# Patient Record
Sex: Female | Born: 1965 | Race: Black or African American | Hispanic: No | Marital: Married | Smoking: Never smoker
Health system: Southern US, Community
[De-identification: ages and names within clinical notes are randomized; demographics above are authoritative.]

## PROBLEM LIST (undated history)

## (undated) DIAGNOSIS — D649 Anemia, unspecified: Secondary | ICD-10-CM

## (undated) HISTORY — PX: DILATION AND CURETTAGE OF UTERUS: SHX78

## (undated) HISTORY — PX: WISDOM TOOTH EXTRACTION: SHX21

## (undated) HISTORY — PX: BREAST EXCISIONAL BIOPSY: SUR124

## (undated) HISTORY — PX: OTHER SURGICAL HISTORY: SHX169

---

## 1998-05-06 ENCOUNTER — Other Ambulatory Visit: Admission: RE | Admit: 1998-05-06 | Discharge: 1998-05-06 | Payer: Self-pay | Admitting: Obstetrics and Gynecology

## 1999-05-08 ENCOUNTER — Other Ambulatory Visit: Admission: RE | Admit: 1999-05-08 | Discharge: 1999-05-08 | Payer: Self-pay | Admitting: Obstetrics and Gynecology

## 2000-06-03 ENCOUNTER — Other Ambulatory Visit: Admission: RE | Admit: 2000-06-03 | Discharge: 2000-06-03 | Payer: Self-pay | Admitting: Obstetrics and Gynecology

## 2001-06-10 ENCOUNTER — Other Ambulatory Visit: Admission: RE | Admit: 2001-06-10 | Discharge: 2001-06-10 | Payer: Self-pay | Admitting: Obstetrics and Gynecology

## 2002-05-25 ENCOUNTER — Emergency Department (HOSPITAL_COMMUNITY): Admission: EM | Admit: 2002-05-25 | Discharge: 2002-05-25 | Payer: Self-pay | Admitting: Emergency Medicine

## 2002-06-01 ENCOUNTER — Emergency Department (HOSPITAL_COMMUNITY): Admission: EM | Admit: 2002-06-01 | Discharge: 2002-06-01 | Payer: Self-pay | Admitting: Emergency Medicine

## 2002-07-31 ENCOUNTER — Other Ambulatory Visit: Admission: RE | Admit: 2002-07-31 | Discharge: 2002-07-31 | Payer: Self-pay | Admitting: Obstetrics and Gynecology

## 2003-09-02 ENCOUNTER — Other Ambulatory Visit: Admission: RE | Admit: 2003-09-02 | Discharge: 2003-09-02 | Payer: Self-pay | Admitting: Obstetrics and Gynecology

## 2004-11-01 ENCOUNTER — Other Ambulatory Visit: Admission: RE | Admit: 2004-11-01 | Discharge: 2004-11-01 | Payer: Self-pay | Admitting: Obstetrics and Gynecology

## 2006-05-17 ENCOUNTER — Ambulatory Visit (HOSPITAL_COMMUNITY): Admission: RE | Admit: 2006-05-17 | Discharge: 2006-05-17 | Payer: Self-pay | Admitting: Obstetrics and Gynecology

## 2006-05-17 ENCOUNTER — Encounter (INDEPENDENT_AMBULATORY_CARE_PROVIDER_SITE_OTHER): Payer: Self-pay | Admitting: Specialist

## 2007-02-26 ENCOUNTER — Encounter: Admission: RE | Admit: 2007-02-26 | Discharge: 2007-02-26 | Payer: Self-pay | Admitting: Obstetrics and Gynecology

## 2007-04-04 ENCOUNTER — Encounter (INDEPENDENT_AMBULATORY_CARE_PROVIDER_SITE_OTHER): Payer: Self-pay | Admitting: General Surgery

## 2007-04-04 ENCOUNTER — Ambulatory Visit (HOSPITAL_BASED_OUTPATIENT_CLINIC_OR_DEPARTMENT_OTHER): Admission: RE | Admit: 2007-04-04 | Discharge: 2007-04-04 | Payer: Self-pay | Admitting: General Surgery

## 2009-04-06 ENCOUNTER — Encounter: Admission: RE | Admit: 2009-04-06 | Discharge: 2009-04-06 | Payer: Self-pay | Admitting: Obstetrics and Gynecology

## 2010-07-25 NOTE — Op Note (Signed)
NAME:  Vanessa Lopez, Vanessa Lopez                  ACCOUNT NO.:  [id number]   MEDICAL RECORD NO.:  [id number]          PATIENT TYPE:  AMB   LOCATION:  DSC                          FACILITY:  [facility]   PHYSICIAN:  Lennie Muckle, MD      DATE OF BIRTH:  May 07, 1965   DATE OF PROCEDURE:  04/04/2007  DATE OF DISCHARGE:                               OPERATIVE REPORT   PREOPERATIVE DIAGNOSIS:  Left breast papilloma.   POSTOPERATIVE DIAGNOSIS:  Left breast papilloma.   PROCEDURE:  Excision of left breast tissue.   ANTHESIA: The patient given MAC anesthesia.   SPECIMEN:  Left breast tissue.   SURGEON:  Lennie Muckle, MD   ASSISTANT:  None.   FINDINGS:  Serous drainage from approximately 2 o'clock on the left  nipple.   ESTIMATED BLOOD LOSS:  Minimal.   COMPLICATIONS:  No immediate complications.   INDICATIONS FOR PROCEDURE:  [name] is a 45 year old female who had  had spontaneous nipple drainage on the left breast.  Ductogram revealed  a papilloma in the left breast approximately in the upper outer quadrant  of the left breast.  On examination she did express fluid from her left  breast.  I recommended excision of the papilloma due to the possibility  of 20% relation with invasive carcinoma.  Informed consent was obtained  prior to the procedure.   DESCRIPTION OF PROCEDURE:  [name] was identified in the preoperative  holding suite.  She was then taken to the operating room.  Once placed  in a supine position she was placed under anesthesia.  She was given MAC  anesthesia.  Her left breast was then prepped and draped in the usual  sterile fashion.  A time out indicating the procedure and the patient  were performed.  Using a mixture of 1% lidocaine and 0.25% Marcaine, I  anesthetized the tissue around the areola in approximately the 12  o'clock to 3 o'clock position.  Skin was incised with a #11 blade along  the areola from 12 to 3 o'clock.  Subcutaneous tissues divided with  electrocautery.  I excised tissue just beneath the nipple areola  complex.  During the procedure there was some drainage elicited.  The  core was then followed deep into the breast, approximately 5 cm.  Using  the electrocautery I removed the specimen.  Anterior margin was marked  at a short suture and lateral was marked with a long, double suture.  The area was irrigated.  Tissues inspected.  Bleeding controlled with  electrocautery.  Minimal bleeding was controlled with electrocautery.  The wound bed was irrigated.  Final inspection revealed no bleeding from  the wound bed.  The breast tissue was reapproximated using 3-0 Vicryl.  Dermal stitch placed of 3-0 Vicryl.  Skin was closed with 4-0 Monocryl  in a running fashion.  Steri-Strips were placed for final dressing.  I  then injected the excision pocket with approximately  a total of 30 mL of the lidocaine/Marcaine mixture.  The patient was  extubated and transported to the Post Anesthesia Care Unit in  stable  condition.  She will follow-up with me in 2-3 weeks.  I will call her  with the pathology when it is available.      Lennie Muckle, MD  Electronically Signed     ALA/MEDQ  D:  04/04/2007  T:  04/04/2007  Job:  [id number]

## 2010-07-28 NOTE — Op Note (Signed)
NAME:  Vanessa Lopez, FERNICOLA NO.:  [id number]   MEDICAL RECORD NO.:  [id number]          PATIENT TYPE:  AMB   LOCATION:  SDC                           FACILITY:  [facility]   PHYSICIAN:  [name], M.D.   DATE OF BIRTH:  15-May-1965   DATE OF PROCEDURE:  05/17/2006  DATE OF DISCHARGE:                               OPERATIVE REPORT   PREOPERATIVE DIAGNOSIS:  Nonviable first trimester of pregnancy.   POSTOPERATIVE DIAGNOSIS:  Nonviable first trimester of pregnancy.   OPERATIVE PROCEDURE:  1. Paracervical block.  2. Dilatation and evacuation.   SURGEON:  [name], M.D.   ANESTHESIA:  Paracervical block and sedation.   ESTIMATED BLOOD LOSS:  Minimal.   PACKS AND DRAINS:  None.   INTRAOPERATIVE BLOOD REPLACEMENT:  None.   COMPLICATIONS:  None.   INDICATIONS:  Dictated in history and physical.   PROCEDURE:  The patient was taken to the OR and placed in supine  position.  After a satisfactory level of sedation, the patient was  placed in the dorsal lithotomy position using the Allen stirrups.  The  patient was draped in a sterile field.  A speculum was placed in the  vaginal vault.  The cervix and vagina were cleansed with Betadine.  Paracervical block was instituted using 1% Nesacaine.  Cervix was  grasped with a single-tooth tenaculum.  The uterus sounded to  approximately 11 cm.  The cervix was serially dilated to a size 31 Pratt  dilator.  A size 8 curved suction curette was introduced and the  intrauterine cavity was emptied using suction curetting; this was  continued until no additional tissue was obtained.  We then sharply  curetted and we felt that all the walls of the uterus were clear by the  gritty feel.  Repeat suction curetting revealed no additional tissue.  Speculum and single-tooth  tenaculum were then removed.  The patient was then taken out of the  dorsal lithotomy position and once alert, transferred to recovery room  in good condition.   Sponge, instrument and needle count were as correct  by circulating nurse.      [name], M.D.  Electronically Signed     JSM/MEDQ  D:  05/17/2006  T:  05/17/2006  Job:  [id number]

## 2010-07-28 NOTE — H&P (Signed)
NAME:  Vanessa Lopez, Vanessa Lopez NO.:  [id number]   MEDICAL RECORD NO.:  [id number]          PATIENT TYPE:  AMB   LOCATION:  SDC                           [facility]   PHYSICIAN:  [name], M.D.   DATE OF BIRTH:  23-Aug-1965   DATE OF ADMISSION:  05/17/2006  DATE OF DISCHARGE:                              HISTORY & PHYSICAL   The patient is 45 year old gravida 2, para 1, female who presents for  D&E.   The patient's last menstrual period was December 19, giving her an  estimated gestational age of approximately 11 weeks.  Was seen the  office yesterday.  Fetal heart tones were not audible.  Subsequent  ultrasound revealed an empty sac at 7 weeks with no evidence of fetal  pole or heart activity, consistent with a nonviable first trimester  pregnancy.  The patient's blood type is O+.   ALLERGIES:  No known drug allergies.   MEDICATION:  Prenatal vitamins.   PAST MEDICAL HISTORY:  Usual childhood diseases.  No significant  sequelae.  No previous surgical history.   OBSTETRICAL HISTORY:  She has had one previous vaginal delivery.   FAMILY HISTORY:  Noncontributory.   SOCIAL HISTORY:  No tobacco or alcohol use.   REVIEW OF SYSTEMS:  Noncontributory.   PHYSICAL EXAMINATION:  VITAL SIGNS:  The patient is afebrile with stable  vital signs.  HEENT: The patient is normocephalic.  Pupils equal, round and reactive  to light and accommodation.  Extraocular movements were intact.  Sclerae  and conjunctivae clear.  Oropharynx clear.  NECK:  Without thyromegaly.  BREASTS:  No discrete masses.  LUNGS:  Clear.  CARDIAC:  Regular rhythm and rate without murmurs or gallops.  ABDOMEN:  Benign.  No mass, organomegaly or tenderness.  PELVIC:  Normal external genitalia.  Vaginal mucosa is clear.  Cervix  unremarkable.  Uterus upper limits of normal size.  Adnexa unremarkable.  EXTREMITIES:  Trace edema.  NEUROLOGIC:  Grossly within normal limits.   IMPRESSION:  Nonviable  first trimester pregnancy.   PLAN:  The patient will undergo dilatation and evacuation.  The risks  have been discussed, including the risk of infection; the risk of  vascular injury that could lead to hemorrhage requiring transfusion with  associated risk of  AIDS or hepatitis.  Continued bleeding could lead to hysterectomy.  The  risk of perforation that could lead to injury to adjacent organs  requiring exploratory surgery.  The risk of deep venous thrombosis and  pulmonary emboli.  The patient expressed an understanding of the  indications and risks.      [name], M.D.  Electronically Signed     JSM/MEDQ  D:  05/17/2006  T:  05/17/2006  Job:  [id number]

## 2010-12-29 ENCOUNTER — Encounter (HOSPITAL_COMMUNITY): Payer: Self-pay

## 2010-12-29 ENCOUNTER — Encounter (HOSPITAL_COMMUNITY)
Admission: RE | Admit: 2010-12-29 | Discharge: 2010-12-29 | Disposition: A | Discharge status: Home / Self Care | Payer: Managed Care, Other (non HMO) | Source: Ambulatory Visit | Attending: Obstetrics and Gynecology | Admitting: Obstetrics and Gynecology

## 2010-12-29 DIAGNOSIS — Z01818 Encounter for other preprocedural examination: Secondary | ICD-10-CM | POA: Insufficient documentation

## 2010-12-29 DIAGNOSIS — Z01812 Encounter for preprocedural laboratory examination: Secondary | ICD-10-CM | POA: Insufficient documentation

## 2010-12-29 HISTORY — DX: Anemia, unspecified: D64.9

## 2010-12-29 LAB — SURGICAL PCR SCREEN: MRSA, PCR: NEGATIVE

## 2010-12-29 LAB — CBC
Hemoglobin: 10.5 g/dL — ABNORMAL LOW (ref 12.0–15.0)
MCH: 26.4 pg (ref 26.0–34.0)
MCHC: 30.8 g/dL (ref 30.0–36.0)

## 2010-12-29 NOTE — Patient Instructions (Addendum)
   Your procedure is scheduled on: Friday, October 26th  Enter through the Main Entrance of [facility] at  7:30am Pick up the phone at the desk and dial [phone] and inform us of your arrival.  Please call this number if you have any problems the morning of surgery: [phone]  Remember: Do not eat food after midnight: Thursday Do not drink clear liquids after:  Thursday Take these medicines the morning of surgery with a SIP OF WATER: none  Do not wear jewelry, make-up, or FINGER nail polish Do not wear lotions, powders, or perfumes.  You may wear deodorant. Do not shave 48 hours prior to surgery. Do not bring valuables to the hospital.  Patients discharged on the day of surgery will not be allowed to drive home.   Name and phone number of your driver:  husband Jonny Ruiz  cell [phone]  Remember to use your hibiclens as instructed.Please shower with 1/2 bottle the evening before your surgery and the other 1/2 bottle the morning of surgery.

## 2010-12-29 NOTE — Pre-Procedure Instructions (Signed)
Ok to see patient DOS. 

## 2011-01-05 ENCOUNTER — Encounter (HOSPITAL_COMMUNITY): Admission: RE | Payer: Self-pay | Source: Ambulatory Visit

## 2011-01-05 ENCOUNTER — Ambulatory Visit (HOSPITAL_COMMUNITY)
Admission: RE | Admit: 2011-01-05 | Payer: Managed Care, Other (non HMO) | Source: Ambulatory Visit | Admitting: Obstetrics and Gynecology

## 2011-01-05 SURGERY — LIGATION, FALLOPIAN TUBE, LAPAROSCOPIC
Anesthesia: General

## 2011-01-25 NOTE — H&P (Signed)
NAME:  KARLEN, BARBAR NO.:  [id number]  MEDICAL RECORD NO.:  [id number]  LOCATION:                                 FACILITY:  PHYSICIAN:  [name], M.D.   DATE OF BIRTH:  1966/01/03  DATE OF ADMISSION:  02/02/2011 DATE OF DISCHARGE:                             HISTORY & PHYSICAL   Date of her surgery is February 02, 2011, at [facility].  The patient is a 45 year old, gravida 2, para 1, abortus 1 female presents for laparoscopic bilateral tubal ligation.  Hysteroscopy with D and C and hydrothermal ablation.  In relation to present admission, the patient has been followed with known uterine fibroids and associated menorrhagia.  This has been associated with significant anemia and is requiring iron sulfate supplementation.  We have tried controlling her menorrhagia with various hormonal agents including recently the Mirena IUD without response now she presents for the above-noted surgery.  In terms of allergies, no known drug allergies.  MEDICATIONS:  Iron sulfate supplementation.  PAST MEDICAL HISTORY:  Usual childhood diseases.  No significant sequelae.  PAST SURGICAL HISTORY:  She has had 1 D and E for nonviable pregnancy. She has had 1 spontaneous vaginal delivery.  FAMILY HISTORY:  Noncontributory.  SOCIAL HISTORY:  Reveals no tobacco or alcohol use.  REVIEW OF SYSTEMS:  Noncontributory.  PHYSICAL EXAMINATION:  VITAL SIGNS:  Patient is afebrile, stable vital signs.  HEENT:  The patient is normocephalic.  Pupils are equal, round, reactive to light and accommodation.  Extraocular movements were intact. Sclerae and conjunctivae are clear.  Oropharynx clear. NECK:  Without thyromegaly.  BREASTS:  No skin or nipple changes, abnormal mass, adenopathy, or nipple discharge.  LUNGS:  Clear. CARDIOVASCULAR:  Regular rhythm and rate without murmurs or gallops. ABDOMEN:  Benign.  No mass, organomegaly, or tenderness. PELVIC:  Normal external  genitalia.  Vaginal mucosa is clear.  Cervix unremarkable.  Uterus approximately 10-12 weeks in size.  Adnexa unremarkable. EXTREMITIES:  Trace edema. NEUROLOGICAL:  Grossly with normal limits.  IMPRESSION:  Uterine fibroids with associated menorrhagia.  PLAN:  Options have been discussed.  We are now going to proceed with hydrothermal ablation and associated sterilization with laparoscopic bilateral tubal ligation.  The nature of the procedure have been discussed.  The risks have been explained including the risk of infection.  The risk of hemorrhage that could require transfusion with the risk of AIDS or hepatitis.  Excessive bleeding that could require hysterectomy.  Risk of injury to adjacent organs including bladder, bowel, ureters that could require further exploratory surgery.  Risk of deep venous thrombosis and pulmonary embolus.  Success rates for ablation have been discussed of approximately 80% with amenorrheic rates of 40%.  She does understand the potential irreversibility of sterilization.  The failure rate of 1/200 is quoted.  Failures can be in the form of ectopic pregnancy requiring further surgical management. The patient expressed understanding of  indications and risks and other options.     [name], M.D.     JSM/MEDQ  D:  01/25/2011  T:  01/25/2011  Job:  [id number]

## 2011-01-25 NOTE — H&P (Signed)
  Patient name Vanessa Lopez DICTATION# [id number] CSN#  [id number]  [name], MD 01/25/2011 5:18 AM

## 2011-01-29 ENCOUNTER — Encounter (HOSPITAL_COMMUNITY): Payer: Self-pay | Admitting: Pharmacy Technician

## 2011-01-31 ENCOUNTER — Other Ambulatory Visit (HOSPITAL_COMMUNITY): Payer: Managed Care, Other (non HMO)

## 2011-01-31 IMAGING — MG SCREENING-BILATERAL MAMMOGRAPHY
4 series · 4 of 4 positions shown · non-contrast
Comparison: 02/11/2005

CLINICAL DATA: Knee osteoarthritis.  Hypertension. Pre-op
respiratory exam.

CHEST - 2 VIEW

[R CC]
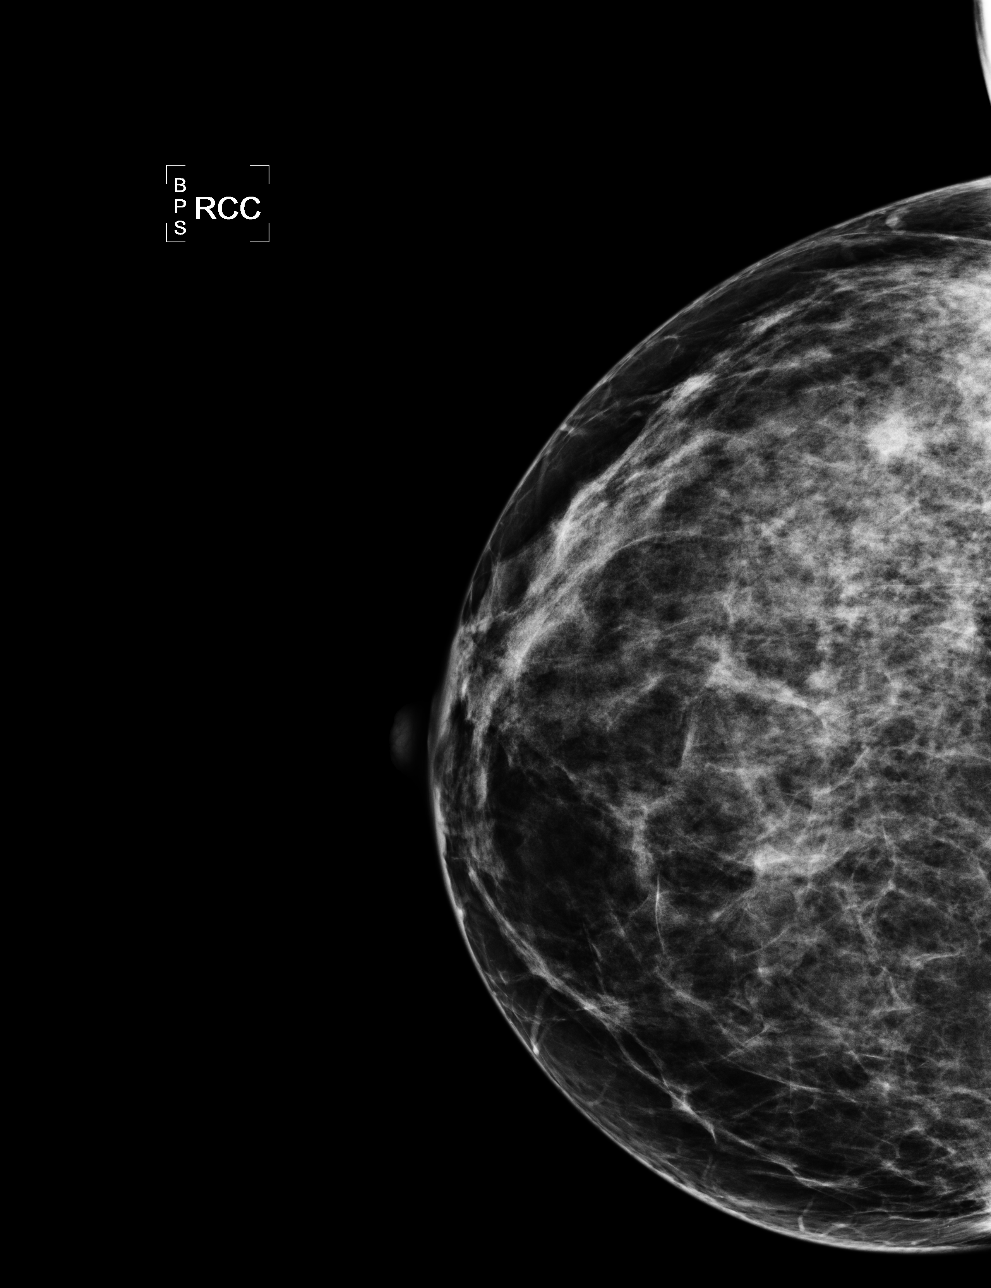

[L CC]
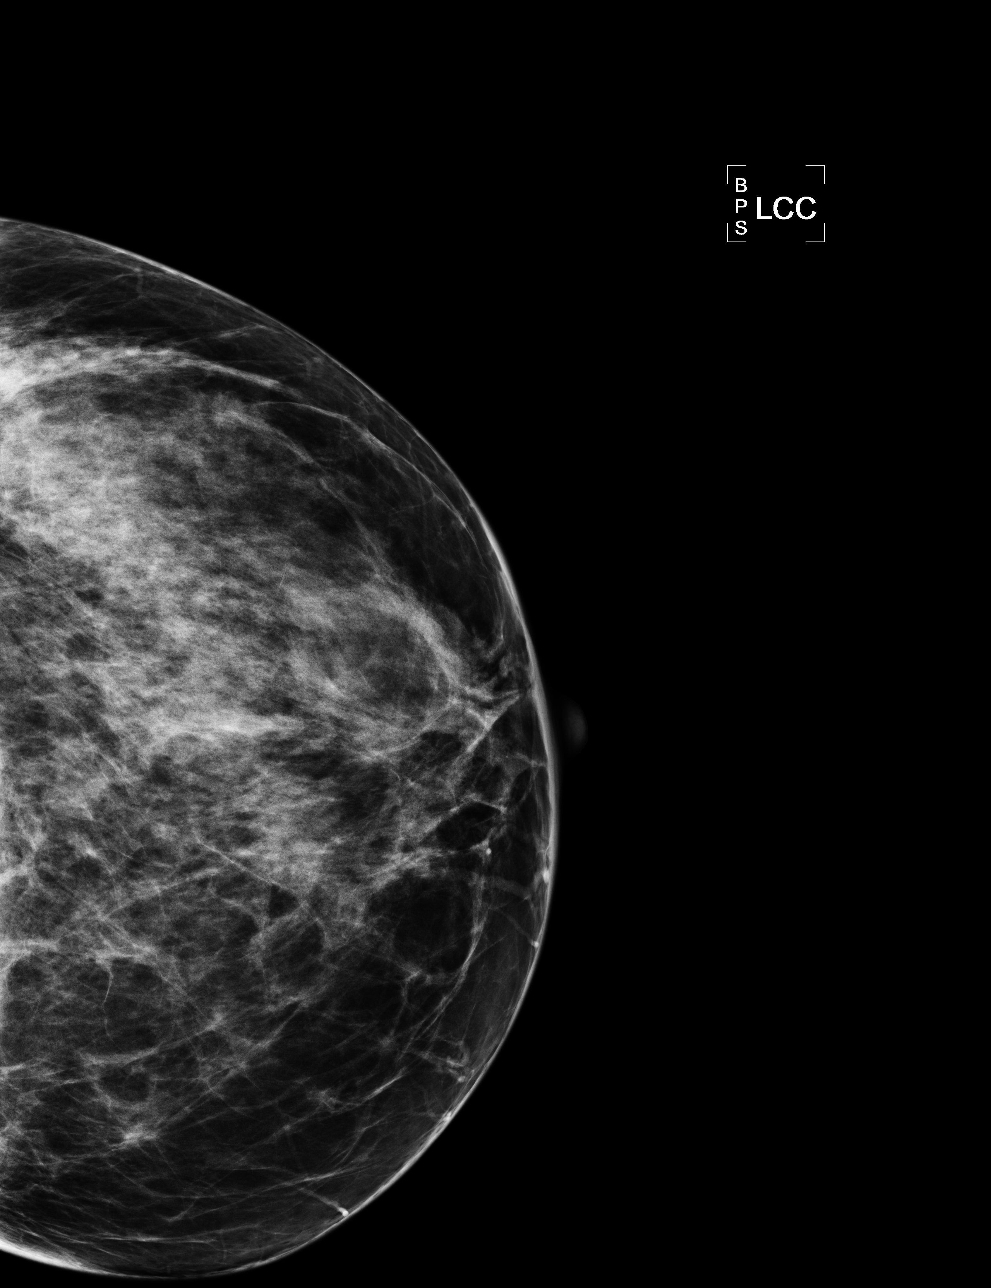

[L MLO]
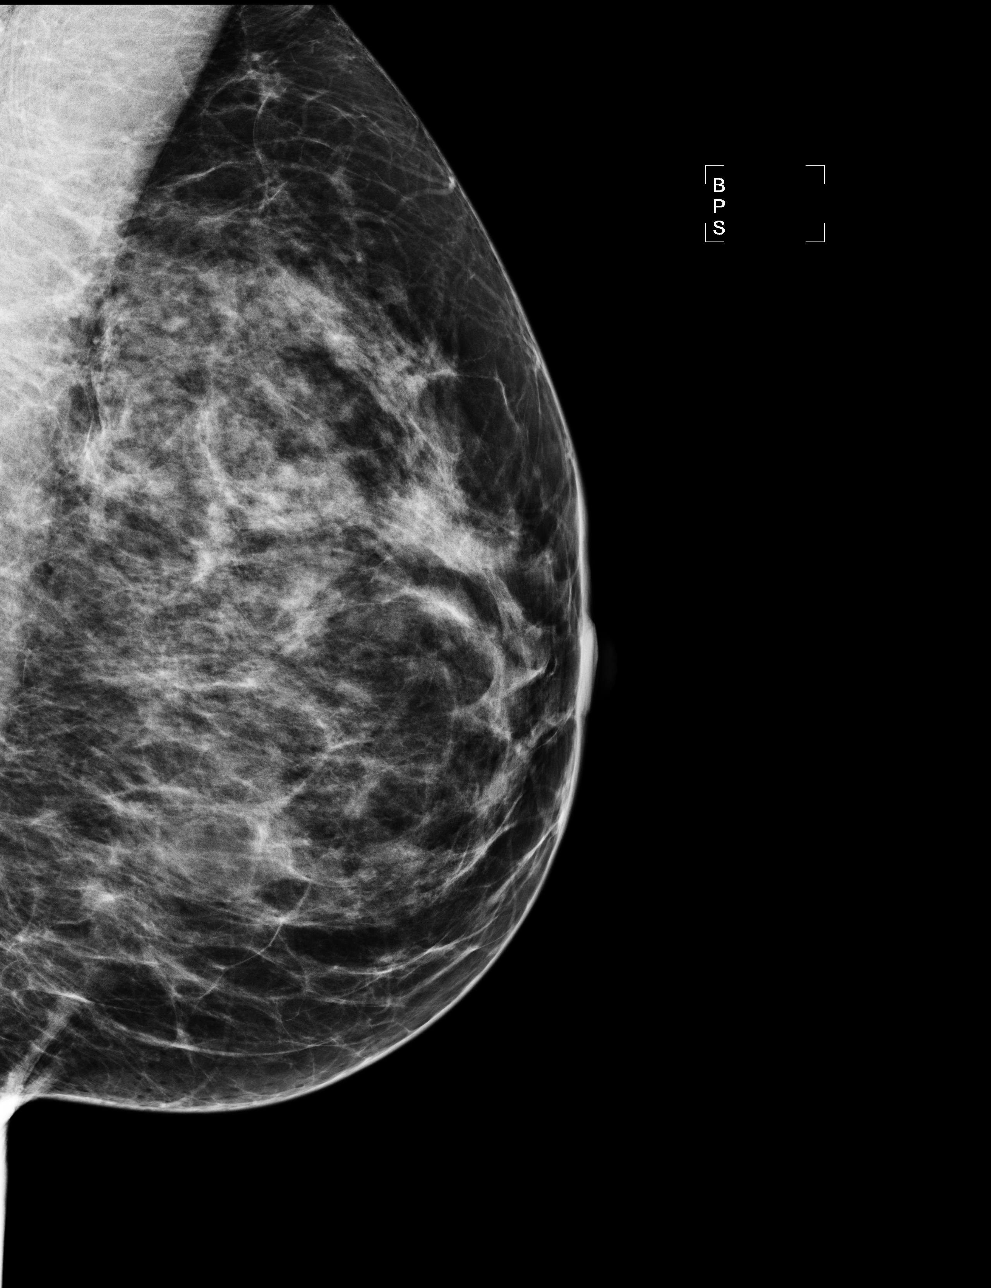

[R MLO]
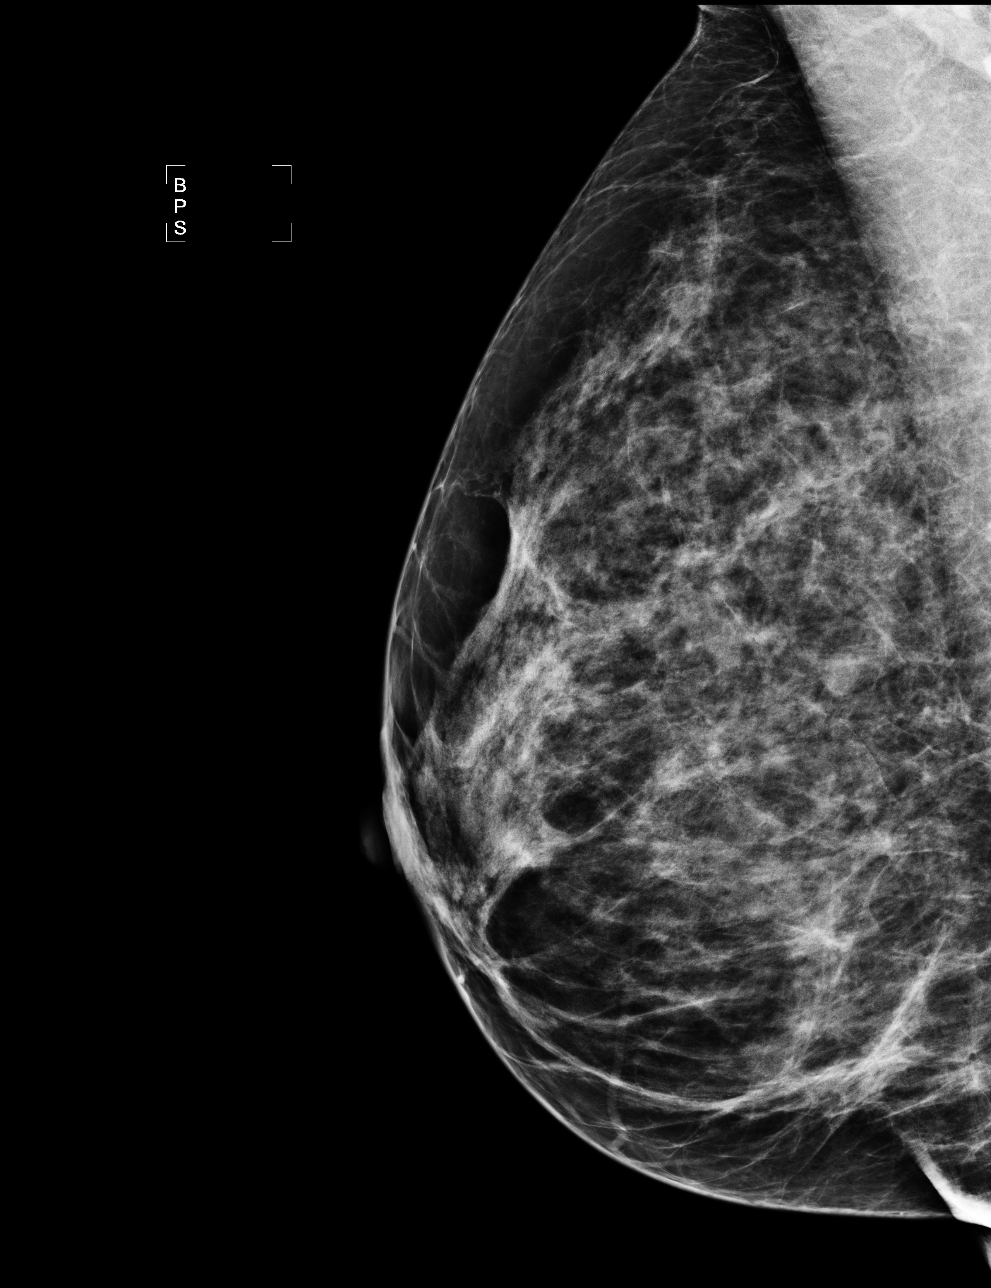

[4 of 4 positions shown; findings below may reference images not displayed]

FINDINGS: Heart size remains normal.  Both lungs are clear.  No
evidence of pulmonary infiltrate or pleural effusion.  No mass or
adenopathy identified.  Right shoulder prosthesis noted.
IMPRESSION: No active cardiopulmonary disease.

## 2011-02-02 ENCOUNTER — Other Ambulatory Visit: Payer: Self-pay | Admitting: Obstetrics and Gynecology

## 2011-02-02 ENCOUNTER — Encounter (HOSPITAL_COMMUNITY)
Admission: RE | Disposition: A | Discharge status: Home / Self Care | Payer: Self-pay | Source: Ambulatory Visit | Attending: Obstetrics and Gynecology

## 2011-02-02 ENCOUNTER — Encounter (HOSPITAL_COMMUNITY): Payer: Self-pay | Admitting: Anesthesiology

## 2011-02-02 ENCOUNTER — Ambulatory Visit (HOSPITAL_COMMUNITY)
Admission: RE | Admit: 2011-02-02 | Discharge: 2011-02-02 | Disposition: A | Discharge status: Home / Self Care | Payer: Managed Care, Other (non HMO) | Source: Ambulatory Visit | Attending: Obstetrics and Gynecology | Admitting: Obstetrics and Gynecology

## 2011-02-02 ENCOUNTER — Encounter (HOSPITAL_COMMUNITY): Payer: Self-pay | Admitting: *Deleted

## 2011-02-02 ENCOUNTER — Ambulatory Visit (HOSPITAL_COMMUNITY): Payer: Managed Care, Other (non HMO) | Admitting: Anesthesiology

## 2011-02-02 DIAGNOSIS — D219 Benign neoplasm of connective and other soft tissue, unspecified: Secondary | ICD-10-CM

## 2011-02-02 DIAGNOSIS — N920 Excessive and frequent menstruation with regular cycle: Secondary | ICD-10-CM | POA: Insufficient documentation

## 2011-02-02 DIAGNOSIS — N946 Dysmenorrhea, unspecified: Secondary | ICD-10-CM

## 2011-02-02 DIAGNOSIS — Z01812 Encounter for preprocedural laboratory examination: Secondary | ICD-10-CM | POA: Insufficient documentation

## 2011-02-02 DIAGNOSIS — Z01818 Encounter for other preprocedural examination: Secondary | ICD-10-CM | POA: Insufficient documentation

## 2011-02-02 DIAGNOSIS — Z302 Encounter for sterilization: Secondary | ICD-10-CM | POA: Insufficient documentation

## 2011-02-02 DIAGNOSIS — D250 Submucous leiomyoma of uterus: Secondary | ICD-10-CM | POA: Insufficient documentation

## 2011-02-02 HISTORY — PX: LAPAROSCOPIC TUBAL LIGATION: SHX1937

## 2011-02-02 LAB — CBC
HCT: 29.8 % — ABNORMAL LOW (ref 36.0–46.0)
MCH: 25.1 pg — ABNORMAL LOW (ref 26.0–34.0)
MCHC: 29.9 g/dL — ABNORMAL LOW (ref 30.0–36.0)
MCV: 83.9 fL (ref 78.0–100.0)
Platelets: 418 10*3/uL — ABNORMAL HIGH (ref 150–400)
RBC: 3.55 MIL/uL — ABNORMAL LOW (ref 3.87–5.11)
RDW: 14.8 % (ref 11.5–15.5)
WBC: 5.1 10*3/uL (ref 4.0–10.5)

## 2011-02-02 SURGERY — DILATATION & CURETTAGE/HYSTEROSCOPY WITH THERMACHOICE ABLATION
Wound class: Clean Contaminated

## 2011-02-02 MED ORDER — LIDOCAINE HCL (PF) 1 % IJ SOLN
INTRAMUSCULAR | Status: DC | PRN
  Administered 2011-02-02: 30 mL

## 2011-02-02 MED ORDER — DEXTROSE 5 % IV SOLN
INTRAVENOUS | Status: DC | PRN
  Administered 2011-02-02: 1000 mL via INTRAVENOUS

## 2011-02-02 MED ORDER — BUPIVACAINE HCL (PF) 0.25 % IJ SOLN
INTRAMUSCULAR | Status: DC | PRN
  Administered 2011-02-02: 10 mL

## 2011-02-02 MED ORDER — FENTANYL CITRATE 0.05 MG/ML IJ SOLN
INTRAMUSCULAR | Status: DC | PRN
  Administered 2011-02-02 (×4): 50 ug via INTRAVENOUS

## 2011-02-02 MED ORDER — LACTATED RINGERS IV SOLN: INTRAVENOUS | Status: DC

## 2011-02-02 MED ORDER — GLYCOPYRROLATE 0.2 MG/ML IJ SOLN
INTRAMUSCULAR | Status: AC
  Filled 2011-02-02: qty 1

## 2011-02-02 MED ORDER — MIDAZOLAM HCL 2 MG/2ML IJ SOLN
INTRAMUSCULAR | Status: AC
  Filled 2011-02-02: qty 2

## 2011-02-02 MED ORDER — FENTANYL CITRATE 0.05 MG/ML IJ SOLN
25.0000 ug | INTRAMUSCULAR | Status: DC | PRN
  Administered 2011-02-02 (×2): 25 ug via INTRAVENOUS

## 2011-02-02 MED ORDER — KETOROLAC TROMETHAMINE 30 MG/ML IJ SOLN
INTRAMUSCULAR | Status: AC
  Administered 2011-02-02: 30 mg via INTRAMUSCULAR
  Filled 2011-02-02: qty 1

## 2011-02-02 MED ORDER — ROCURONIUM BROMIDE 50 MG/5ML IV SOLN
INTRAVENOUS | Status: AC
  Filled 2011-02-02: qty 1

## 2011-02-02 MED ORDER — KETOROLAC TROMETHAMINE 30 MG/ML IJ SOLN: 15.0000 mg | Freq: Once | INTRAMUSCULAR | Status: DC | PRN

## 2011-02-02 MED ORDER — KETOROLAC TROMETHAMINE 30 MG/ML IJ SOLN
30.0000 mg | Freq: Once | INTRAMUSCULAR | Status: AC
  Administered 2011-02-02: 30 mg via INTRAMUSCULAR

## 2011-02-02 MED ORDER — FENTANYL CITRATE 0.05 MG/ML IJ SOLN
INTRAMUSCULAR | Status: AC
  Filled 2011-02-02: qty 2

## 2011-02-02 MED ORDER — FENTANYL CITRATE 0.05 MG/ML IJ SOLN
INTRAMUSCULAR | Status: AC
  Filled 2011-02-02: qty 5

## 2011-02-02 MED ORDER — MEPERIDINE HCL 25 MG/ML IJ SOLN: 6.2500 mg | INTRAMUSCULAR | Status: DC | PRN

## 2011-02-02 MED ORDER — KETOROLAC TROMETHAMINE 30 MG/ML IJ SOLN
INTRAMUSCULAR | Status: AC
  Filled 2011-02-02: qty 1

## 2011-02-02 MED ORDER — LIDOCAINE HCL (CARDIAC) 20 MG/ML IV SOLN
INTRAVENOUS | Status: DC | PRN
  Administered 2011-02-02: 20 mg via INTRAVENOUS
  Administered 2011-02-02: 50 mg via INTRAVENOUS

## 2011-02-02 MED ORDER — ONDANSETRON HCL 4 MG/2ML IJ SOLN
INTRAMUSCULAR | Status: AC
  Filled 2011-02-02: qty 2

## 2011-02-02 MED ORDER — LIDOCAINE HCL (CARDIAC) 20 MG/ML IV SOLN
INTRAVENOUS | Status: AC
  Filled 2011-02-02: qty 5

## 2011-02-02 MED ORDER — ONDANSETRON HCL 4 MG/2ML IJ SOLN
INTRAMUSCULAR | Status: DC | PRN
  Administered 2011-02-02: 4 mg via INTRAVENOUS

## 2011-02-02 MED ORDER — DEXAMETHASONE SODIUM PHOSPHATE 10 MG/ML IJ SOLN
INTRAMUSCULAR | Status: AC
  Filled 2011-02-02: qty 1

## 2011-02-02 MED ORDER — GLYCINE 1.5 % IR SOLN
Status: DC | PRN
  Administered 2011-02-02: 1

## 2011-02-02 MED ORDER — MUPIROCIN 2 % EX OINT
TOPICAL_OINTMENT | CUTANEOUS | Status: AC
  Administered 2011-02-02: 08:00:00 via NASAL
  Filled 2011-02-02: qty 22

## 2011-02-02 MED ORDER — PROMETHAZINE HCL 25 MG/ML IJ SOLN
INTRAMUSCULAR | Status: AC
  Filled 2011-02-02: qty 1

## 2011-02-02 MED ORDER — ONDANSETRON HCL 4 MG/2ML IJ SOLN
INTRAMUSCULAR | Status: AC
  Administered 2011-02-02: 4 mg via INTRAVENOUS
  Filled 2011-02-02: qty 2

## 2011-02-02 MED ORDER — PROPOFOL 10 MG/ML IV EMUL
INTRAVENOUS | Status: DC | PRN
  Administered 2011-02-02: 150 mg via INTRAVENOUS

## 2011-02-02 MED ORDER — KETOROLAC TROMETHAMINE 30 MG/ML IM SOLN: 30.0000 mg | Freq: Once | INTRAMUSCULAR | Status: AC

## 2011-02-02 MED ORDER — LACTATED RINGERS IV SOLN
INTRAVENOUS | Status: DC | PRN
  Administered 2011-02-02 (×2): via INTRAVENOUS

## 2011-02-02 MED ORDER — ONDANSETRON HCL 4 MG/2ML IJ SOLN
4.0000 mg | Freq: Once | INTRAMUSCULAR | Status: AC | PRN
  Administered 2011-02-02: 4 mg via INTRAVENOUS

## 2011-02-02 MED ORDER — ROCURONIUM BROMIDE 100 MG/10ML IV SOLN
INTRAVENOUS | Status: DC | PRN
  Administered 2011-02-02: 20 mg via INTRAVENOUS

## 2011-02-02 MED ORDER — PROMETHAZINE HCL 25 MG/ML IJ SOLN
12.5000 mg | Freq: Four times a day (QID) | INTRAMUSCULAR | Status: DC | PRN
  Administered 2011-02-02: 12.5 mg via INTRAVENOUS

## 2011-02-02 MED ORDER — OXYCODONE-ACETAMINOPHEN 5-500 MG PO CAPS: 1.0000 | ORAL_CAPSULE | ORAL | Status: AC | PRN

## 2011-02-02 MED ORDER — MIDAZOLAM HCL 5 MG/5ML IJ SOLN
INTRAMUSCULAR | Status: DC | PRN
  Administered 2011-02-02: 1 mg via INTRAVENOUS

## 2011-02-02 MED ORDER — PROPOFOL 10 MG/ML IV EMUL
INTRAVENOUS | Status: AC
  Filled 2011-02-02: qty 20

## 2011-02-02 MED ORDER — LACTATED RINGERS IV SOLN
INTRAVENOUS | Status: DC
  Administered 2011-02-02 (×2): 125 mL/h via INTRAVENOUS

## 2011-02-02 SURGICAL SUPPLY — 22 items
CATH ROBINSON RED A/P 16FR (CATHETERS) ×3 IMPLANT
CATH THERMACHOICE III (CATHETERS) ×2 IMPLANT
CLOTH BEACON ORANGE TIMEOUT ST (SAFETY) ×3 IMPLANT
CONTAINER PREFILL 10% NBF 60ML (FORM) ×6 IMPLANT
DRAPE CAMERA CLOSED 9X96 (DRAPES) ×3 IMPLANT
DRAPE HYSTEROSCOPY (DRAPE) ×3 IMPLANT
DRAPE UTILITY XL STRL (DRAPES) ×3 IMPLANT
GLOVE BIO SURGEON STRL SZ7 (GLOVE) ×6 IMPLANT
GOWN PREVENTION PLUS LG XLONG (DISPOSABLE) ×6 IMPLANT
NDL INSUFFLATION 14GA 120MM (NEEDLE) IMPLANT
NEEDLE INSUFFLATION 14GA 120MM (NEEDLE) IMPLANT
PACK LAPAROSCOPY BASIN (CUSTOM PROCEDURE TRAY) ×3 IMPLANT
PACK VAGINAL MINOR WOMEN LF (CUSTOM PROCEDURE TRAY) ×3 IMPLANT
SET GENESYS HTA PROCERVA (MISCELLANEOUS) ×3 IMPLANT
STRIP CLOSURE SKIN 1/4X3 (GAUZE/BANDAGES/DRESSINGS) ×3 IMPLANT
SUT VIC AB 3-0 FS2 27 (SUTURE) ×3 IMPLANT
SUT VICRYL 0 UR6 27IN ABS (SUTURE) IMPLANT
TOWEL OR 17X24 6PK STRL BLUE (TOWEL DISPOSABLE) ×6 IMPLANT
TROCAR BALLN 12MMX100 BLUNT (TROCAR) IMPLANT
TROCAR Z-THREAD BLADED 11X100M (TROCAR) IMPLANT
WARMER LAPAROSCOPE (MISCELLANEOUS) ×3 IMPLANT
WATER STERILE IRR 1000ML POUR (IV SOLUTION) ×3 IMPLANT

## 2011-02-02 NOTE — Anesthesia Postprocedure Evaluation (Signed)
Anesthesia Post Note  Patient: Vanessa Lopez  Procedure(s) Performed:  DILATATION & CURETTAGE/HYSTEROSCOPY WITH THERMACHOICE ABLATION; LAPAROSCOPIC TUBAL LIGATION  Anesthesia type: General  Patient location: PACU  Post pain: Pain level controlled  Post assessment: Post-op Vital signs reviewed  Last Vitals:  Filed Vitals:   02/02/11 1109  BP: 132/87  Pulse: 63  Temp: 36.6 C  Resp: 43    Post vital signs: Reviewed  Level of consciousness: sedated  Complications: No apparent anesthesia complicationsfj

## 2011-02-02 NOTE — Transfer of Care (Signed)
Immediate Anesthesia Transfer of Care Note  Patient: Vanessa Lopez  Procedure(s) Performed:  DILATATION & CURETTAGE/HYSTEROSCOPY WITH THERMACHOICE ABLATION; LAPAROSCOPIC TUBAL LIGATION  Patient Location: PACU  Anesthesia Type: General  Level of Consciousness: awake  Airway & Oxygen Therapy: Patient Spontanous Breathing and Patient connected to nasal cannula oxygen  Post-op Assessment: Report given to PACU RN and Patient moving all extremities  Post vital signs: Reviewed and stable  Complications: No apparent anesthesia complications

## 2011-02-02 NOTE — Op Note (Signed)
NAME:  Vanessa Lopez, Vanessa Lopez NO.:  [id number]  MEDICAL RECORD NO.:  [id number]  LOCATION:  [facility]                          FACILITY:  [facility]  PHYSICIAN:  [name], M.D.   DATE OF BIRTH:  [birth year]  DATE OF PROCEDURE:  02/02/2011 DATE OF DISCHARGE:                              OPERATIVE REPORT   PREOPERATIVE DIAGNOSIS:  Menorrhagia secondary to uterine fibroids.  POSTOPERATIVE DIAGNOSE:  Menorrhagia secondary to uterine fibroids with evidence of a submucosal fibroid.  OPERATIVE PROCEDURES:  Cervical dilatation with curettage.  Attempt at the hydrothermal ablation which failed.  Hysteroscopic resection of the submucosal fibroid.  Thermachoice ablation.  Laparoscopic bilateral tubal ligation.  SURGEON:  [name], MD  ANESTHESIA:  General.  ESTIMATED BLOOD LOSS:  100 mL.  PACKS AND DRAINS:  None.  INTRAOPERATIVE BLOOD PLACED:  None.  COMPLICATIONS:  None.  INDICATIONS:  Dictated in history and physical.  PROCEDURE:  The patient was taken to the OR and placed in supine position.  After satisfactory level of general anesthesia was obtained, the patient was placed in the dorsal lithotomy position using the Allen stirrups.  The abdomen, perineum, and vagina were prepped out with Betadine and draped out for hysteroscopy.  A speculum was placed in the vaginal vault.  The cervix was grasped with single-tooth tenaculum. Uterus sounded approximately 11 cm.  Cervix dilated to a size 23 Pratt dilator.  Endometrial curettings were obtained and sent for pathological review.  The instrument for hydrothermal ablation was inserted into endocervical canal.  Intrauterine cavity was then distended using saline.  We then attempted to do hydrothermal ablation, however, we continued to have pressure failures.  We attempted to use another single- tooth tenaculum to seal on the cervix on the adaptor.  We still could not generate pressure and said the system failed.  At this  point in time, we brought the resectoscope in, distended the intrauterine cavity with glycine.  Visualization revealed again the submucosal fibroid.  We resected that partially to the level of the endometrium.  Then, we brought the Thermachoice ablator.  We irrigated out the intrauterine cavity with lactated Ringer's.  The Thermachoice was primed and inserted.  We filled it with approximately 30 mL of D5W.  However, we could not get to the proper temperature.  We therefore de-inflated the balloon.  We got another Thermachoice instrument.  We primed it.  We inserted into the intrauterine cavity and again distended with approximately 30 mL of D5W.  We were able to maintain pressure and we were able to read the proper temperature of 87 degrees.  Ablation cycle was 8 minutes.  We then had cool down and Thermachoice was removed intact.  The hysteroscope had fairly universal ablation.  At this point in time, the Hulka tenaculum was put in place.  Single- tooth tenaculum was removed.  Bladder was then followed with catheterization.  A subumbilical incision made with a knife.  The Veress needle was introduced in the abdominal cavity.  Abdomen was inflated with approximately 2 L of carbon dioxide.  Operating laparoscope was introduced.  There was no evidence of injury to adjacent organs.  Upper abdomen  including liver, tip of the gallbladder, and both lateral gutters were clear.  Appendix was retrocecal.  Uterus was enlarged with fibroids.  Tubes and ovaries were unremarkable.  Using the bipolar, the mid segment of each tube was coagulated until resistance read 0.  We then re-coagulated the same segment of the tube completely desiccating the tube.  Coagulation did extend out to the mesosalpinx.  Total length of coagulation was approximately 2.5 cm.  There was no evidence of injury to adjacent organs.  At this point in time, the abdomen was deflated with carbon dioxide.  Laparoscope and trocars  were removed. Subumbilical incision was closed with interrupted subcuticulars of 4-0 Vicryl.  Hulka tenaculum was then removed.  The patient was taken out of the dorsal lithotomy position.  Once alert, extubated and transferred to the recovery room in good condition.  Sponge, instrument, and needle count were reported as correct by circulating nurse x2.     [name], M.D.     JSM/MEDQ  D:  02/02/2011  T:  02/02/2011  Job:  [id number]

## 2011-02-02 NOTE — Progress Notes (Signed)
Attempt made to get pt up and ambulate to bathroom, pt nauseated and vomiting again, Dr Arby Barrette made aware, orders received, will continue to monitor and manage nausea.

## 2011-02-02 NOTE — Progress Notes (Signed)
Patient ID: Vanessa Lopez, female   DOB: [birth year], 45 y.o.   MRN: [id number] Patient name DICTATION#  [id number] CSN# [id number]  [name], MD 02/02/2011 11:03 AM

## 2011-02-02 NOTE — Anesthesia Preprocedure Evaluation (Signed)
Anesthesia Evaluation  Patient identified by MRN, date of birth, ID band Patient awake    Reviewed: Allergy & Precautions, H&P , NPO status , Patient's Chart, lab work & pertinent test results  Airway Mallampati: I TM Distance: >3 FB Neck ROM: full    Dental No notable dental hx. (+) Teeth Intact   Pulmonary neg pulmonary ROS,    Pulmonary exam normal       Cardiovascular neg cardio ROS     Neuro/Psych Negative Neurological ROS  Negative Psych ROS   GI/Hepatic negative GI ROS, Neg liver ROS,   Endo/Other  Negative Endocrine ROS  Renal/GU negative Renal ROS  Genitourinary negative   Musculoskeletal negative musculoskeletal ROS (+)   Abdominal Normal abdominal exam  (+)   Peds negative pediatric ROS (+)  Hematology negative hematology ROS (+)   Anesthesia Other Findings   Reproductive/Obstetrics negative OB ROS                           Anesthesia Physical Anesthesia Plan  ASA: I  Anesthesia Plan: General   Post-op Pain Management:    Induction: Intravenous  Airway Management Planned: Oral ETT  Additional Equipment:   Intra-op Plan:   Post-operative Plan: Extubation in OR  Informed Consent: I have reviewed the patients History and Physical, chart, labs and discussed the procedure including the risks, benefits and alternatives for the proposed anesthesia with the patient or authorized representative who has indicated his/her understanding and acceptance.   Dental Advisory Given  Plan Discussed with: CRNA  Anesthesia Plan Comments:         Anesthesia Quick Evaluation  

## 2011-02-02 NOTE — Brief Op Note (Signed)
02/02/2011  11:01 AM  PATIENT:  Vanessa Lopez  45 y.o. female  PRE-OPERATIVE DIAGNOSIS:  Menorrhagia; Fibroids; Desires Sterilization  POST-OPERATIVE DIAGNOSIS:  Menorrhagia; Fibroids; Desires Sterilization  PROCEDURE:  Procedure(s): DILATATION & CURETTAGE/HYSTEROSCOPY WITH THERMACHOICE ABLATION LAPAROSCOPIC TUBAL LIGATION  SURGEON:  Surgeon(s): Ramello Cordial S Ozzie Remmers  PHYSICIAN ASSISTANT:   ASSISTANTS: none   ANESTHESIA:   general  EBL:  Total I/O In: 1500 [I.V.:1500] Out: 120 [Urine:100; Blood:20]  BLOOD ADMINISTERED:none  DRAINS: none   LOCAL MEDICATIONS USED:  MARCAINE 5CC  20cc of xylocine with epi SPECIMEN:  Source of Specimen:  endometrial currettings and fibroid  DISPOSITION OF SPECIMEN:  PATHOLOGY  COUNTS:  YES  TOURNIQUET:  * No tourniquets in log *  DICTATION: .Other Dictation: Dictation Number [id number]  PLAN OF CARE: Discharge to home after PACU  PATIENT DISPOSITION:  PACU - hemodynamically stable.   Delay start of Pharmacological VTE agent (>24hrs) due to surgical blood loss or risk of bleeding:  {YES/NO/NOT APPLICABLE:20182

## 2011-02-05 ENCOUNTER — Encounter (HOSPITAL_COMMUNITY): Payer: Self-pay | Admitting: Obstetrics and Gynecology

## 2012-07-23 ENCOUNTER — Other Ambulatory Visit: Payer: Self-pay | Admitting: Obstetrics and Gynecology

## 2012-07-23 DIAGNOSIS — R928 Other abnormal and inconclusive findings on diagnostic imaging of breast: Secondary | ICD-10-CM

## 2012-07-31 ENCOUNTER — Ambulatory Visit
Admission: RE | Admit: 2012-07-31 | Discharge: 2012-07-31 | Disposition: A | Discharge status: Home / Self Care | Payer: Managed Care, Other (non HMO) | Source: Ambulatory Visit | Attending: Obstetrics and Gynecology | Admitting: Obstetrics and Gynecology

## 2012-07-31 DIAGNOSIS — R928 Other abnormal and inconclusive findings on diagnostic imaging of breast: Secondary | ICD-10-CM

## 2012-08-05 ENCOUNTER — Other Ambulatory Visit: Payer: Managed Care, Other (non HMO)

## 2013-01-10 ENCOUNTER — Encounter (HOSPITAL_BASED_OUTPATIENT_CLINIC_OR_DEPARTMENT_OTHER): Payer: Self-pay | Admitting: Emergency Medicine

## 2013-01-10 ENCOUNTER — Emergency Department (HOSPITAL_BASED_OUTPATIENT_CLINIC_OR_DEPARTMENT_OTHER): Payer: Managed Care, Other (non HMO)

## 2013-01-10 ENCOUNTER — Emergency Department (HOSPITAL_BASED_OUTPATIENT_CLINIC_OR_DEPARTMENT_OTHER)
Admission: EM | Admit: 2013-01-10 | Discharge: 2013-01-10 | Disposition: A | Discharge status: Home / Self Care | Payer: Managed Care, Other (non HMO) | Attending: Emergency Medicine | Admitting: Emergency Medicine

## 2013-01-10 DIAGNOSIS — Z79899 Other long term (current) drug therapy: Secondary | ICD-10-CM | POA: Insufficient documentation

## 2013-01-10 DIAGNOSIS — Z862 Personal history of diseases of the blood and blood-forming organs and certain disorders involving the immune mechanism: Secondary | ICD-10-CM | POA: Insufficient documentation

## 2013-01-10 DIAGNOSIS — R6889 Other general symptoms and signs: Secondary | ICD-10-CM | POA: Insufficient documentation

## 2013-01-10 DIAGNOSIS — R062 Wheezing: Secondary | ICD-10-CM | POA: Insufficient documentation

## 2013-01-10 DIAGNOSIS — R498 Other voice and resonance disorders: Secondary | ICD-10-CM | POA: Insufficient documentation

## 2013-01-10 DIAGNOSIS — R059 Cough, unspecified: Secondary | ICD-10-CM | POA: Insufficient documentation

## 2013-01-10 DIAGNOSIS — R05 Cough: Secondary | ICD-10-CM | POA: Insufficient documentation

## 2013-01-10 DIAGNOSIS — J3489 Other specified disorders of nose and nasal sinuses: Secondary | ICD-10-CM | POA: Insufficient documentation

## 2013-01-10 MED ORDER — PSEUDOEPH-CHLORPHEN-HYDROCOD 60-4-5 MG/5ML PO SOLN: 5.0000 mL | Freq: Four times a day (QID) | ORAL | Status: DC | PRN

## 2013-01-10 MED ORDER — ALBUTEROL SULFATE HFA 108 (90 BASE) MCG/ACT IN AERS
2.0000 | INHALATION_SPRAY | RESPIRATORY_TRACT | Status: DC | PRN
  Administered 2013-01-10: 2 via RESPIRATORY_TRACT
  Filled 2013-01-10: qty 6.7

## 2013-01-10 MED ORDER — AZITHROMYCIN 250 MG PO TABS: ORAL_TABLET | ORAL | Status: DC

## 2013-01-10 NOTE — ED Notes (Signed)
Cough x 1 week   States has taken several OTC's

## 2013-01-10 NOTE — ED Provider Notes (Signed)
CSN: [id number]     Arrival date & time 01/10/13  0302 History   First MD Initiated Contact with Patient 01/10/13 0316     Chief Complaint  Patient presents with  . Cough   (Consider location/radiation/quality/duration/timing/severity/associated sxs/prior Treatment) HPI This is a 47 year old female developed an upper respiratory infection one week ago. It started with nasal congestion, scratchy throat but no fever. The next day she developed a cough which is persisted through the week. It worsened overnight and she had a paroxysm that lasted about 2 hours earlier. She states it was associated with a whooping sound as well as wheezing. She denies fever, nausea, vomiting or diarrhea. She has developed a hoarse voice. The cough has been nonproductive. She's been taking multiple over-the-counter medications without relief.  Past Medical History  Diagnosis Date  . Anemia     history   Past Surgical History  Procedure Laterality Date  . Breast excisional biopsy      left  . Wisdom tooth extraction    . Svd      x 1  . Dilation and curettage of uterus    . Laparoscopic tubal ligation  02/02/2011    Procedure: LAPAROSCOPIC TUBAL LIGATION;  Surgeon: [name];  Location: [facility];  Service: Gynecology;  Laterality: Bilateral;   No family history on file. History  Substance Use Topics  . Smoking status: Never Smoker   . Smokeless tobacco: Never Used  . Alcohol Use: Yes     Comment: 7 glasses per week - wine   OB History   Grav Para Term Preterm Abortions TAB SAB Ect Mult Living                 Review of Systems  All other systems reviewed and are negative.    Allergies  Review of patient's allergies indicates no known allergies.  Home Medications   Current Outpatient Rx  Name  Route  Sig  Dispense  Refill  . fluocinonide (LIDEX) 0.05 % ointment   Topical   Apply 1 application topically 3 (three) times a week. To scalp          . minoxidil (ROGAINE) 2 % external  solution   Topical   Apply 1 application topically daily.            BP 144/96  Pulse 73  Temp(Src) 98.4 F (36.9 C) (Oral)  Resp 20  Ht 5\' 8"  (1.727 m)  Wt 130 lb (58.968 kg)  BMI 19.77 kg/m2  SpO2 100%  Physical Exam General: Well-developed, well-nourished female in no acute distress; appears younger than age of record HENT: normocephalic; atraumatic; no pharyngeal erythema or exudate; dysphonia Eyes: pupils equal, round and reactive to light; extraocular muscles intact Neck: supple; no lymphadenopathy Heart: regular rate and rhythm Lungs: clear to auscultation bilaterally; dry cough Abdomen: soft; nondistended; nontender; no masses or hepatosplenomegaly; bowel sounds present Extremities: No deformity; full range of motion Neurologic: Awake, alert and oriented; motor function intact in all extremities and symmetric; no facial droop Skin: Warm and dry Psychiatric: Normal mood and affect    ED Course  Procedures (including critical care time)   MDM  Nursing notes and vitals signs, including pulse oximetry, reviewed.  Summary of this visit's results, reviewed by myself:  Imaging Studies: Dg Chest 2 View  01/10/2013   CLINICAL DATA:  Cough.  EXAM: CHEST  2 VIEW  COMPARISON:  None.  FINDINGS: The heart size and mediastinal contours are within normal limits. Both  lungs are clear of edema or consolidation. No effusion or pneumothorax. The visualized skeletal structures are unremarkable.  IMPRESSION: No evidence of active cardiopulmonary disease.   Electronically Signed   By: Tiburcio Pea M.D.   On: 01/10/2013 03:46   We'll treat with Zithromax for possible pertussis.     Hanley Seamen, MD 01/10/13 [phone]

## 2014-05-22 ENCOUNTER — Emergency Department (HOSPITAL_BASED_OUTPATIENT_CLINIC_OR_DEPARTMENT_OTHER)
Admission: EM | Admit: 2014-05-22 | Discharge: 2014-05-22 | Disposition: A | Discharge status: Home / Self Care | Payer: Managed Care, Other (non HMO) | Attending: Emergency Medicine | Admitting: Emergency Medicine

## 2014-05-22 ENCOUNTER — Encounter (HOSPITAL_BASED_OUTPATIENT_CLINIC_OR_DEPARTMENT_OTHER): Payer: Self-pay

## 2014-05-22 DIAGNOSIS — Y288XXA Contact with other sharp object, undetermined intent, initial encounter: Secondary | ICD-10-CM | POA: Insufficient documentation

## 2014-05-22 DIAGNOSIS — S41111A Laceration without foreign body of right upper arm, initial encounter: Secondary | ICD-10-CM | POA: Diagnosis not present

## 2014-05-22 DIAGNOSIS — Z79899 Other long term (current) drug therapy: Secondary | ICD-10-CM | POA: Diagnosis not present

## 2014-05-22 DIAGNOSIS — Z862 Personal history of diseases of the blood and blood-forming organs and certain disorders involving the immune mechanism: Secondary | ICD-10-CM | POA: Insufficient documentation

## 2014-05-22 DIAGNOSIS — Z792 Long term (current) use of antibiotics: Secondary | ICD-10-CM | POA: Insufficient documentation

## 2014-05-22 DIAGNOSIS — Y9289 Other specified places as the place of occurrence of the external cause: Secondary | ICD-10-CM | POA: Diagnosis not present

## 2014-05-22 DIAGNOSIS — Y9339 Activity, other involving climbing, rappelling and jumping off: Secondary | ICD-10-CM | POA: Insufficient documentation

## 2014-05-22 DIAGNOSIS — Y998 Other external cause status: Secondary | ICD-10-CM | POA: Insufficient documentation

## 2014-05-22 MED ORDER — LIDOCAINE-EPINEPHRINE (PF) 2 %-1:200000 IJ SOLN
INTRAMUSCULAR | Status: AC
  Filled 2014-05-22: qty 10

## 2014-05-22 MED ORDER — TETANUS-DIPHTH-ACELL PERTUSSIS 5-2.5-18.5 LF-MCG/0.5 IM SUSP
0.5000 mL | Freq: Once | INTRAMUSCULAR | Status: AC
  Administered 2014-05-22: 0.5 mL via INTRAMUSCULAR
  Filled 2014-05-22: qty 0.5

## 2014-05-22 MED ORDER — LIDOCAINE-EPINEPHRINE 2 %-1:100000 IJ SOLN: 20.0000 mL | Freq: Once | INTRAMUSCULAR | Status: DC

## 2014-05-22 NOTE — ED Notes (Signed)
Pt reports was outside in a tree, posterior right arm got caught on a nail, sustained laceration.  Reports no debris in wound.

## 2014-05-22 NOTE — Discharge Instructions (Signed)
Keep the cut clean and dry. Triple antibiotic ointment twice a day. Ibuprofen or Tylenol for pain. He can apply some ice for swelling. Follow-up with primary care doctor or urgent care for suture removal in 7-10 days. Watch for signs of infection. If it's red, swollen, draining, return to emergency department or your doctor   Laceration Care, Adult A laceration is a cut or lesion that goes through all layers of the skin and into the tissue just beneath the skin. TREATMENT  Some lacerations may not require closure. Some lacerations may not be able to be closed due to an increased risk of infection. It is important to see your caregiver as soon as possible after an injury to minimize the risk of infection and maximize the opportunity for successful closure. If closure is appropriate, pain medicines may be given, if needed. The wound will be cleaned to help prevent infection. Your caregiver will use stitches (sutures), staples, wound glue (adhesive), or skin adhesive strips to repair the laceration. These tools bring the skin edges together to allow for faster healing and a better cosmetic outcome. However, all wounds will heal with a scar. Once the wound has healed, scarring can be minimized by covering the wound with sunscreen during the day for 1 full year. HOME CARE INSTRUCTIONS  For sutures or staples:  Keep the wound clean and dry.  If you were given a bandage (dressing), you should change it at least once a day. Also, change the dressing if it becomes wet or dirty, or as directed by your caregiver.  Wash the wound with soap and water 2 times a day. Rinse the wound off with water to remove all soap. Pat the wound dry with a clean towel.  After cleaning, apply a thin layer of the antibiotic ointment as recommended by your caregiver. This will help prevent infection and keep the dressing from sticking.  You may shower as usual after the first 24 hours. Do not soak the wound in water until the  sutures are removed.  Only take over-the-counter or prescription medicines for pain, discomfort, or fever as directed by your caregiver.  Get your sutures or staples removed as directed by your caregiver. For skin adhesive strips:  Keep the wound clean and dry.  Do not get the skin adhesive strips wet. You may bathe carefully, using caution to keep the wound dry.  If the wound gets wet, pat it dry with a clean towel.  Skin adhesive strips will fall off on their own. You may trim the strips as the wound heals. Do not remove skin adhesive strips that are still stuck to the wound. They will fall off in time. For wound adhesive:  You may briefly wet your wound in the shower or bath. Do not soak or scrub the wound. Do not swim. Avoid periods of heavy perspiration until the skin adhesive has fallen off on its own. After showering or bathing, gently pat the wound dry with a clean towel.  Do not apply liquid medicine, cream medicine, or ointment medicine to your wound while the skin adhesive is in place. This may loosen the film before your wound is healed.  If a dressing is placed over the wound, be careful not to apply tape directly over the skin adhesive. This may cause the adhesive to be pulled off before the wound is healed.  Avoid prolonged exposure to sunlight or tanning lamps while the skin adhesive is in place. Exposure to ultraviolet light in the first  year will darken the scar.  The skin adhesive will usually remain in place for 5 to 10 days, then naturally fall off the skin. Do not pick at the adhesive film. You may need a tetanus shot if:  You cannot remember when you had your last tetanus shot.  You have never had a tetanus shot. If you get a tetanus shot, your arm may swell, get red, and feel warm to the touch. This is common and not a problem. If you need a tetanus shot and you choose not to have one, there is a rare chance of getting tetanus. Sickness from tetanus can be  serious. SEEK MEDICAL CARE IF:   You have redness, swelling, or increasing pain in the wound.  You see a red line that goes away from the wound.  You have yellowish-white fluid (pus) coming from the wound.  You have a fever.  You notice a bad smell coming from the wound or dressing.  Your wound breaks open before or after sutures have been removed.  You notice something coming out of the wound such as wood or glass.  Your wound is on your hand or foot and you cannot move a finger or toe. SEEK IMMEDIATE MEDICAL CARE IF:   Your pain is not controlled with prescribed medicine.  You have severe swelling around the wound causing pain and numbness or a change in color in your arm, hand, leg, or foot.  Your wound splits open and starts bleeding.  You have worsening numbness, weakness, or loss of function of any joint around or beyond the wound.  You develop painful lumps near the wound or on the skin anywhere on your body. MAKE SURE YOU:   Understand these instructions.  Will watch your condition.  Will get help right away if you are not doing well or get worse. Document Released: 02/26/2005 Document Revised: 05/21/2011 Document Reviewed: 08/22/2010 [facility] Patient Information 2015 [place], [place]. This information is not intended to replace advice given to you by your health care provider. Make sure you discuss any questions you have with your health care provider.

## 2014-05-22 NOTE — ED Provider Notes (Signed)
CSN: [id number]     Arrival date & time 05/22/14  1150 History   First MD Initiated Contact with Patient 05/22/14 1501     Chief Complaint  Patient presents with  . Extremity Laceration     (Consider location/radiation/quality/duration/timing/severity/associated sxs/prior Treatment) HPI Vanessa Lopez is a 49 y.o. female with hx of anemia. Presents to emergency department complaining of laceration to the right arm. Patient states that she was climbing a tree trying to get a plastic bag off of the tree, and states she fell down and cut her upper right arm on a rusty nail. Patient states that laceration bled but bleeding is controlled with pressure. She denies any numbness or weakness distal to the cut. Her tetanus is out of date. There is no other injuries. She did not hit her head or have loss of consciousness. With the treatment prior to coming in. Pain is sharp, worsened with movement and palpation of the area.  Past Medical History  Diagnosis Date  . Anemia     history   Past Surgical History  Procedure Laterality Date  . Breast excisional biopsy      left  . Wisdom tooth extraction    . Svd      x 1  . Dilation and curettage of uterus    . Laparoscopic tubal ligation  02/02/2011    Procedure: LAPAROSCOPIC TUBAL LIGATION;  Surgeon: Darlyn Chamber;  Location: [facility];  Service: Gynecology;  Laterality: Bilateral;   No family history on file. History  Substance Use Topics  . Smoking status: Never Smoker   . Smokeless tobacco: Never Used  . Alcohol Use: Yes     Comment: 7 glasses per week - wine   OB History    No data available     Review of Systems  Constitutional: Negative for fever and chills.  Respiratory: Negative for cough, chest tightness and shortness of breath.   Cardiovascular: Negative for chest pain, palpitations and leg swelling.  Musculoskeletal: Negative for myalgias, arthralgias, neck pain and neck stiffness.  Skin: Positive for wound. Negative for rash.    Neurological: Negative for dizziness, weakness and headaches.  All other systems reviewed and are negative.     Allergies  Review of patient's allergies indicates no known allergies.  Home Medications   Prior to Admission medications   Medication Sig Start Date End Date Taking? Authorizing Provider  azithromycin (ZITHROMAX Z-PAK) 250 MG tablet 2 po day one, then 1 daily x 4 days 01/10/13   Shanon Rosser, MD  fluocinonide (LIDEX) 0.05 % ointment Apply 1 application topically 3 (three) times a week. To scalp     Historical Provider, MD  minoxidil (ROGAINE) 2 % external solution Apply 1 application topically daily.      Historical Provider, MD  progesterone (PROMETRIUM) 100 MG capsule Take 100 mg by mouth daily. Pt does not know what dosage    Historical Provider, MD  Pseudoeph-Chlorphen-Hydrocod 60-4-5 MG/5ML SOLN Take 5 mLs by mouth every 6 (six) hours as needed (for cough). 01/10/13   John Molpus, MD   BP 123/82 mmHg  Pulse 69  Temp(Src) 97.9 F (36.6 C) (Oral)  Resp 18  Ht 5\' 9"  (1.753 m)  Wt 130 lb (58.968 kg)  BMI 19.19 kg/m2  SpO2 99%  LMP 04/02/2014 Physical Exam  Constitutional: She is oriented to person, place, and time. She appears well-developed and well-nourished. No distress.  HENT:  Head: Normocephalic.  Eyes: Conjunctivae are normal.  Neck: Neck supple.  Cardiovascular: Normal rate, regular rhythm and normal heart sounds.   Pulmonary/Chest: Effort normal and breath sounds normal. No respiratory distress. She has no wheezes. She has no rales.  Musculoskeletal: She exhibits no edema.  Full range of motion of the right shoulder, elbow, wrist. Strength is intact over bicep, tricep, forearm muscles. Sensation is intact in all dermatomes of right arm and hand. Cap refill less than 2 seconds distally. Patient is able to make a thumbs up, okay sign, spread all the fingers.  Neurological: She is alert and oriented to person, place, and time.  Skin: Skin is warm and dry.      4cm laceration to the medial right upper arm. Hemostatic. Laceration is gaping  Psychiatric: She has a normal mood and affect. Her behavior is normal.  Nursing note and vitals reviewed.   ED Course  Procedures (including critical care time) Labs Review Labs Reviewed - No data to display  Imaging Review No results found.   EKG Interpretation None      LACERATION REPAIR Performed by: Renold Genta Authorized by: Jeannett Senior A Consent: Verbal consent obtained. Risks and benefits: risks, benefits and alternatives were discussed Consent given by: patient Patient identity confirmed: provided demographic data Prepped and Draped in normal sterile fashion Wound explored  Laceration Location: right upper arm  Laceration Length: 4cm  No Foreign Bodies seen or palpated  Anesthesia: local infiltration  Local anesthetic: lidocaine 2% w/ epinephrine  Anesthetic total: 3 ml  Irrigation method: syringe Amount of cleaning: standard  Skin closure: prolene 4.0   Number of sutures: 5  Technique: simple interrupted  Patient tolerance: Patient tolerated the procedure well with no immediate complications.  MDM   Final diagnoses:  Laceration of right upper arm, initial encounter    Pt with laceration to the right upper arm.  No evidence of tendon, nerve, vascular injury. Repaired with sutures, tetanus updated. Home with outpatient follow up as needed and suture removal.   Filed Vitals:   05/22/14 1156 05/22/14 1610  BP: 123/82 127/76  Pulse: 69 64  Temp: 97.9 F (36.6 C)   TempSrc: Oral   Resp: 18 16  Height: 5\' 9"  (1.753 m)   Weight: 130 lb (58.968 kg)   SpO2: 99% 98%       Jeannett Senior, PA-C 05/23/14 0009  Charlesetta Shanks, MD 05/23/14 1459

## 2014-10-07 ENCOUNTER — Other Ambulatory Visit: Payer: Self-pay | Admitting: Obstetrics and Gynecology

## 2014-10-08 LAB — CYTOLOGY - PAP

## 2015-12-30 LAB — HM COLONOSCOPY

## 2018-01-18 ENCOUNTER — Ambulatory Visit (INDEPENDENT_AMBULATORY_CARE_PROVIDER_SITE_OTHER): Payer: Commercial Managed Care - POS | Admitting: Family

## 2018-01-18 ENCOUNTER — Encounter (INDEPENDENT_AMBULATORY_CARE_PROVIDER_SITE_OTHER): Payer: Self-pay

## 2018-01-18 VITALS — BP 125/90 | HR 70 | Temp 98.1°F | Resp 20 | Ht 69.0 in | Wt 128.0 lb

## 2018-01-18 DIAGNOSIS — R197 Diarrhea, unspecified: Secondary | ICD-10-CM

## 2018-01-18 LAB — POCT URINALYSIS AUTOMATED (IAH)
Bilirubin, UA POCT: NEGATIVE
Blood, UA POCT: NEGATIVE
Glucose, UA POCT: NEGATIVE
Ketones, UA POCT: NEGATIVE mg/dL
Nitrite, UA POCT: NEGATIVE
PH, UA POCT: 6 (ref 4.6–8)
Protein, UA POCT: NEGATIVE mg/dL
Specific Gravity, UA POCT: <=1.005 mg/dL (ref 1.001–1.035)
Urine Leukocytes POCT: NEGATIVE
Urobilinogen, UA POCT: 0.2 mg/dL

## 2018-01-18 NOTE — Progress Notes (Signed)
[facility]  PROGRESS NOTE     Patient: Belinda Rubio   Date: 01/18/2018   MRN: [id number]       Belinda Rubio is a 52 y.o. female      HISTORY     Chief Complaint   Patient presents with   . Diarrhea     onset over a week, and  blood in the stool onset today.         52 y/o/f presents with diarrhea x 1 week this morning she woke up with a bloody diarrhea.  Pt states that on last Thursday night she ate [place] food.  She woke up on Friday with 8-10 liquid stools.  She took some Pepto bismol  Which mildly decreased the frequency.  On Monday she had a [facility].  She was told to tame Imodium.  She stopped taking the imodium on Wednesday because the diarrhea had lessen to once per day and is able to drink well. Still with decreased appetite. Last night she ate pizza for dinner and with her first stool she noticed blood in the toilet and when she wiped herself.  The abdominal cramping has lessen.  Denies bloating, fever, or n/v.  No recent travel.   Last colonoscopy 1.5 years ago completely normal per pt.      History provided by:  Patient  Language interpreter used: No    Diarrhea    This is a new problem. The current episode started 1 to 4 weeks ago. The problem occurs less than 2 times per day. The problem has been gradually improving. The stool consistency is described as bloody (has been with pebble stools). The patient states that diarrhea does not awaken her from sleep. Associated symptoms include abdominal pain and weight loss. Pertinent negatives include no arthralgias, bloating, chills, coughing, fever, headaches, myalgias or vomiting.       Review of Systems   Constitutional: Positive for weight loss. Negative for chills, diaphoresis and fever.   HENT: Negative for congestion, rhinorrhea, sneezing and sore throat.         No recent illness     Respiratory: Negative for cough and shortness of breath.    Cardiovascular: Negative for chest pain.   Gastrointestinal: Positive for abdominal  pain, blood in stool and diarrhea. Negative for abdominal distention, bloating, constipation, nausea and vomiting.   Genitourinary: Negative for dysuria, flank pain, frequency, hematuria, menstrual problem, pelvic pain, urgency, vaginal bleeding and vaginal discharge.   Musculoskeletal: Negative for arthralgias, back pain (no CVA tenderness) and myalgias.   Skin: Negative for pallor and rash.   Neurological: Negative for dizziness and headaches.   Hematological: Negative for adenopathy.   Psychiatric/Behavioral: Negative for confusion.   All other systems reviewed and are negative.      History:  No past medical history on file.    No past surgical history on file.    No family history on file.    Social History     Tobacco Use   . Smoking status: Never Smoker   . Smokeless tobacco: Never Used   Substance Use Topics   . Alcohol use: Yes   . Drug use: Never       History reviewed.        Current Outpatient Medications:   .  finasteride (PROSCAR) 5 MG tablet, Take 5 mg by mouth daily, Disp: , Rfl: 0    No Known Allergies    Medications and Allergies reviewed.  PHYSICAL EXAM     Vitals:    01/18/18 1140   BP: 125/90   BP Site: Right arm   Patient Position: Sitting   Cuff Size: Medium   Pulse: 70   Resp: 20   Temp: 98.1 F (36.7 C)   TempSrc: Oral   SpO2: 98%   Weight: 58.1 kg (128 lb)   Height: 1.753 m (5\' 9" )       Physical Exam   Nursing note and vitals reviewed.  Constitutional: She is oriented to person, place, and time. She appears well-developed and well-nourished. No distress.   HENT:   Head: Normocephalic and atraumatic.   Cardiovascular: Normal rate.   No murmur heard.  Pulmonary/Chest: Effort normal and breath sounds normal. No respiratory distress.   Abdominal: Soft. Bowel sounds are normal. She exhibits no distension. There is tenderness in the left lower quadrant.   Neurological: She is alert and oriented to person, place, and time.   Skin: Skin is warm and dry.   Psychiatric: She has a normal mood and  affect. Her behavior is normal.         UCC COURSE       Results     Procedure Component Value Units Date/Time    UA Clinitek (urine dipstick) [[id number]]  (Normal) Collected:  01/18/18 1223     Updated:  01/18/18 1224     Color UA POCT Yellow     Clarity UA POCT Clear     Glucose, UA POCT Negative     Bilirubin, UA POCT Negative     Ketones, UA POCT Negative mg/dL      Specific Gravity, UA POCT <=1.005 mg/dL      Blood, UA POCT  Negative     PH, UA POCT 6.0     Protein, UA POCT Negative mg/dL      Urobilinogen, UA POCT 0.2 mg/dL      Nitrite, UA POCT Negative     Leukocytes, UA POCT Negative            No results found.      No orders of the defined types were placed in this encounter.        PROCEDURES     Procedures       ASSESSMENT     Encounter Diagnosis   Name Primary?   . Diarrhea, unspecified type Yes          SSESSMENT    PLAN        Push fluid  Avoid irritating foods  Bring back stool samples  Follow up promptly with persistent blood in stool  Follow up with pcp 1-2 days    Discussed results and diagnosis with patient/family.  Reviewed warning signs for worsening condition, as well as, indications for follow-up with pmd and return to urgent care clinic.   Patient/family expressed understanding of instructions.    Orders Placed This Encounter   Procedures   . Stool Culture (IL/LC/QU)   . Ova and parasite examination (IL/LC/QU)   . Stool WBC (IL/LC/QU)   . UA Clinitek (urine dipstick)         An After Visit Summary was printed and given to the patient.      Signed,  Karlene Lineman, FNP

## 2018-01-18 NOTE — Patient Instructions (Signed)
Diarrhea with Uncertain Cause (Adult)    Diarrhea is when stools are loose and watery. This can be caused by:   Viral infections   Bacterial infections   Food poisoning   Parasites   Irritable bowel syndrome (IBS)   Inflammatory bowel diseases such as ulcerative colitis, Crohn's disease, and celiac disease   Food intolerance, such as to lactose, the sugar found in milk and milk products   Reaction to medicines like antibiotics, laxatives, cancer drugs, and antacids  Along with diarrhea, you may also have:   Abdominal pain and cramping   Nausea and vomiting   Loss of bowel control   Fever and chills   Bloody stools  In some cases, antibiotics may help to treat diarrhea. You may have a stool sample test. This is done to see what is causing your diarrhea, and if antibiotics will help treat it. The results of a stool sample test may take up to 2 days. The healthcare provider may not give you antibiotics until he or she has the stool test results.  Diarrhea can cause dehydration. This is the loss of too much water and other fluids from the body. When this occurs, body fluid must be replaced. This can be done with oral rehydration solutions. Oral rehydration solutions are available at drugstores and grocery stores without a prescription. Sports drinks are not the best choice if you are very dehydrated. They have too much sugar and not enough electrolytes.  Home care  Follow all instructions given by your healthcare provider. Rest at home for the next 24 hours, or until you feel better. Avoid caffeine, tobacco, and alcohol. These can make diarrhea, cramping, and pain worse.  If taking medicines:   Over-the-counter nausea and diarrhea medicines are generally OK unless you experience fever or blood stool. Check with your doctor first in those circumstances.   You may use acetaminophen or NSAID medicines like ibuprofen or naproxen to reduce pain and fever. Don't use these if you have chronic liver or kidney  disease, or ever had a stomach ulcer or gastrointestinalbleeding. Don't use NSAID medicines if you are already taking one for another condition (like arthritis) or are on daily aspirin therapy (such as for heart disease or after a stroke). Talk with your healthcare provider first.   If antibiotics were prescribed, be sure you take them until they are finished. Don't stop taking them even when you feel better. Antibiotics must be taken as a full course.  To prevent the spread of illness:   Remember that washing with soap and water and using alcohol-based sanitizer is the best way to prevent the spread of infection. Dry your hands with a single use towel (like a paper towel).   Clean the toilet after each use.   Wash your hands before eating.   Wash your hands before and after preparing food. Keep in mind that people with diarrhea or vomiting should not prepare food for others.   Wash your hands after using cutting boards, countertops, and knives that have been in contact with raw foods.   Wash and then peel fruits and vegetables.   Keep uncooked meats away from cooked and ready-to-eat foods.   Use a food thermometer when cooking. Cook poultry to at least 165F (74C). Cook ground meat (beef, veal, pork, lamb) to at least 160F (71C). Cook fresh beef, veal, lamb, and pork to at least 145F (63C).   Don't eat raw or undercooked eggs (poached or sunny side up), poultry, meat,   or unpasteurized milk and juices.  Food and drinks  The main goal while treating vomiting or diarrhea is to prevent dehydration. This is done by taking small amounts of liquids often.   Keep in mind that liquids are more important than food right now.   Drink only small amounts of liquids at a time.   Don't force yourself to eat, especially if you arehaving cramping, vomiting, or diarrhea. Don't eat large amounts at a time, even if you are hungry.   If you eat, avoid fatty, greasy, spicy, or fried foods.   Don't eat dairy foods  or drink milk if you have diarrhea.These can makediarrhea worse.  During the first 24 hours you can try:   Oral rehydration solutions. Sports drinks may be used if you are not too dehydrated and are otherwise healthy.   Soft drinks without caffeine   Ginger ale   Water (plain or flavored)   Decaf tea or coffee   Clear broth, consomm, or bouillon   Gelatin, popsicles, or frozen fruit juice bars  The second 24 hours, if you are feeling better, you can add:   Hot cereal, plain toast, bread, rolls, or crackers   Plain noodles, rice, mashed potatoes, chicken noodle soup, or rice soup   Unsweetened canned fruit (no pineapple)   Bananas  As you recover:   Limit fat intake to less than 15 grams per day. Don't eat margarine, butter, oils, mayonnaise, sauces, gravies, fried foods, peanut butter, meat, poultry, or fish.   Limit fiber. Don't eat raw or cooked vegetables, fresh fruits except bananas, or bran cereals.   Limit caffeine and chocolate.   Limit dairy.   Don't use spices or seasonings except salt.   Go back to your normal diet over time, as you feel better and your symptoms improve.   If the symptoms come back, go back to a simple diet or clear liquids.  Follow-up care  Follow up with your healthcare provider, or as advised. If a stool sample was taken or cultures were done, call the healthcare provider for the results as instructed.  Call 911  Call 911 if you have any of these symptoms:   Trouble breathing   Confusion   Extreme drowsiness or trouble walking   Loss of consciousness   Rapid heart rate   Chest pain   Stiff neck   Seizure  When to seek medical advice  Call your healthcare provider right away if any of these occur:   Abdominal pain that gets worse   Constant lower right abdominal pain   Continued vomiting and inability to keep liquids down   Diarrhea more than 5 times a day   Blood in vomit or stool   Dark urine or no urine for 8 hours, dry mouth and tongue, tiredness,  weakness, or dizziness   Drowsiness   New rash   You don't get better in 2 to 3 days   Fever of 100.33F (38C) or higher, or as directed by your healthcare provider  [employer] last reviewed this educational content on 08/10/2016   2000-2019 [employer], [place]. [address], [place], [place] [address]. All rights reserved. This information is not intended as a substitute for professional medical care. Always follow your healthcare professional's instructions.    Push fluid  Bring back stool samples  Follow up promptly with persistent blood in stool  Follow up with pcp 1-2 days

## 2018-01-20 ENCOUNTER — Encounter (FREE_STANDING_LABORATORY_FACILITY): Payer: Commercial Managed Care - POS

## 2018-01-20 DIAGNOSIS — R197 Diarrhea, unspecified: Secondary | ICD-10-CM

## 2018-01-20 LAB — STOOL FOR WBC

## 2018-01-20 NOTE — Addendum Note (Signed)
Addended by: Georgian Co on: 01/20/2018 09:10 AM     Modules accepted: Orders

## 2018-01-22 ENCOUNTER — Telehealth (INDEPENDENT_AMBULATORY_CARE_PROVIDER_SITE_OTHER): Payer: Self-pay | Admitting: Family Medicine

## 2018-01-22 DIAGNOSIS — A029 Salmonella infection, unspecified: Secondary | ICD-10-CM

## 2018-01-22 MED ORDER — CIPROFLOXACIN HCL 500 MG PO TABS
ORAL_TABLET | ORAL | 0 refills | Status: AC
Start: 2018-01-22 — End: 2018-01-27

## 2018-01-22 NOTE — Telephone Encounter (Signed)
Pt feeling better. today solid stool. D/W tx for Salmonella. watch and wait or start Cipro based on guidelines. Pt opted for abx.

## 2018-01-23 NOTE — Progress Notes (Signed)
Please let pt know the stool culture is positive for Salmonella.  Her symptoms should improved by now but if she continue to have diarrhea and blood in stool to follow up with PCP.

## 2018-01-28 LAB — OVA AND PARASITE EXAMINATION

## 2018-02-01 NOTE — Progress Notes (Signed)
Please let pt know the stool culture is negative.  Follow up with PCP if symptoms persisted.

## 2020-10-24 LAB — HM DEXA SCAN

## 2021-12-05 LAB — HM MAMMOGRAPHY

## 2021-12-07 LAB — HM PAP SMEAR: HPV, high-risk: NEGATIVE

## 2022-06-13 ENCOUNTER — Ambulatory Visit: Payer: Managed Care, Other (non HMO) | Admitting: Medical

## 2022-06-28 ENCOUNTER — Ambulatory Visit: Payer: Managed Care, Other (non HMO) | Admitting: Medical

## 2022-07-03 ENCOUNTER — Encounter: Payer: Self-pay | Admitting: Medical

## 2022-07-03 ENCOUNTER — Ambulatory Visit (HOSPITAL_BASED_OUTPATIENT_CLINIC_OR_DEPARTMENT_OTHER)
Admission: RE | Admit: 2022-07-03 | Discharge: 2022-07-03 | Disposition: A | Discharge status: Home / Self Care | Payer: Managed Care, Other (non HMO) | Source: Ambulatory Visit | Attending: Medical | Admitting: Medical

## 2022-07-03 ENCOUNTER — Ambulatory Visit (INDEPENDENT_AMBULATORY_CARE_PROVIDER_SITE_OTHER): Payer: Managed Care, Other (non HMO) | Admitting: Medical

## 2022-07-03 VITALS — BP 130/78 | HR 72 | Resp 18 | Ht 69.0 in | Wt 136.8 lb

## 2022-07-03 DIAGNOSIS — S2002XD Contusion of left breast, subsequent encounter: Secondary | ICD-10-CM | POA: Diagnosis not present

## 2022-07-03 DIAGNOSIS — R0781 Pleurodynia: Secondary | ICD-10-CM | POA: Diagnosis present

## 2022-07-03 NOTE — Patient Instructions (Addendum)
1. Rib pain on right side 2 view xray 1 month ago approx showed no fx that I see on radiology report. Will get rib series to get better view. This may reveal fracture. If xray negative and pain persists consider referral to sports med MD. - DG Ribs Unilateral Right; Future  2. Contusion of left breast, subsequent encounter On inspection bruise still present but about 1/3 size compared to picture. I expect will gradually subside by another 4-6 weeks.  Would ask you get copy of old colonoscopy report so we can review and know when to refer for repeat.  Ask you schedule wellness exam within a month. Schedule early morning 8-9 am.  Follow up in one month or sooner if needed.
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
## 2022-07-13 ENCOUNTER — Telehealth: Payer: Self-pay | Admitting: Medical

## 2022-07-13 NOTE — Telephone Encounter (Signed)
Pt dropped off copies of medical records from prior PCP, pt would like provider to see and have on her chart. Documents put at front office tray under providers name.

## 2022-07-13 NOTE — Telephone Encounter (Signed)
Placed in red folder  

## 2022-08-02 ENCOUNTER — Ambulatory Visit: Payer: Managed Care, Other (non HMO) | Admitting: Medical

## 2022-08-02 ENCOUNTER — Encounter: Payer: Self-pay | Admitting: Medical

## 2022-08-02 VITALS — BP 130/70 | HR 80 | Resp 18 | Ht 69.0 in | Wt 134.4 lb

## 2022-08-02 DIAGNOSIS — T148XXA Other injury of unspecified body region, initial encounter: Secondary | ICD-10-CM

## 2022-08-02 DIAGNOSIS — Z0001 Encounter for general adult medical examination with abnormal findings: Secondary | ICD-10-CM | POA: Diagnosis not present

## 2022-08-02 DIAGNOSIS — S2002XD Contusion of left breast, subsequent encounter: Secondary | ICD-10-CM

## 2022-08-02 DIAGNOSIS — Z0000 Encounter for general adult medical examination without abnormal findings: Secondary | ICD-10-CM

## 2022-08-02 DIAGNOSIS — R0781 Pleurodynia: Secondary | ICD-10-CM

## 2022-08-02 LAB — LIPID PANEL
Cholesterol: 171 mg/dL (ref 0–200)
HDL: 77.9 mg/dL (ref >39.00)
LDL Cholesterol: 82 mg/dL (ref 0–99)
NonHDL: 93.33
Total CHOL/HDL Ratio: 2
Triglycerides: 58 mg/dL (ref 0.0–149.0)
VLDL: 11.6 mg/dL (ref 0.0–40.0)

## 2022-08-02 LAB — COMPREHENSIVE METABOLIC PANEL
ALT: 16 U/L (ref 0–35)
AST: 22 U/L (ref 0–37)
Albumin: 4.3 g/dL (ref 3.5–5.2)
Alkaline Phosphatase: 113 U/L (ref 39–117)
BUN: 12 mg/dL (ref 6–23)
CO2: 29 mEq/L (ref 19–32)
Calcium: 9.3 mg/dL (ref 8.4–10.5)
Chloride: 106 mEq/L (ref 96–112)
Creatinine, Ser: 0.81 mg/dL (ref 0.40–1.20)
GFR: 80.27 mL/min (ref >60.00)
Glucose, Bld: 71 mg/dL (ref 70–99)
Potassium: 4.4 mEq/L (ref 3.5–5.1)
Sodium: 141 mEq/L (ref 135–145)
Total Bilirubin: 0.6 mg/dL (ref 0.2–1.2)
Total Protein: 7 g/dL (ref 6.0–8.3)

## 2022-08-02 LAB — CBC WITH DIFFERENTIAL/PLATELET
Basophils Absolute: 0 10*3/uL (ref 0.0–0.1)
Basophils Relative: 0.9 % (ref 0.0–3.0)
Eosinophils Absolute: 0.2 10*3/uL (ref 0.0–0.7)
Eosinophils Relative: 5.5 % — ABNORMAL HIGH (ref 0.0–5.0)
HCT: 41.5 % (ref 36.0–46.0)
Hemoglobin: 13.3 g/dL (ref 12.0–15.0)
Lymphocytes Relative: 41 % (ref 12.0–46.0)
Lymphs Abs: 1.9 10*3/uL (ref 0.7–4.0)
MCHC: 32.1 g/dL (ref 30.0–36.0)
MCV: 96.5 fl (ref 78.0–100.0)
Monocytes Absolute: 0.3 10*3/uL (ref 0.1–1.0)
Monocytes Relative: 7 % (ref 3.0–12.0)
Neutro Abs: 2.1 10*3/uL (ref 1.4–7.7)
Neutrophils Relative %: 45.6 % (ref 43.0–77.0)
Platelets: 294 10*3/uL (ref 150.0–400.0)
RBC: 4.3 Mil/uL (ref 3.87–5.11)
RDW: 12.9 % (ref 11.5–15.5)
WBC: 4.5 10*3/uL (ref 4.0–10.5)

## 2022-08-02 NOTE — Telephone Encounter (Signed)
Copies placed to scan , original sent to patient's home address

## 2022-08-02 NOTE — Addendum Note (Signed)
Addended by: Gwenevere Abbot on: 08/02/2022 09:08 AM   Modules accepted: Orders

## 2022-08-02 NOTE — Patient Instructions (Addendum)
For you wellness exam today I have ordered cbc, cmp and lipid panel.  Vaccine up to date.  Recommend exercise and healthy diet.  We will let you know lab results as they come in.  Follow up date appointment will be determined after lab review.    Rt rib pain now resolved. Negative xray. Suspect was bruised rib type pain.  Left breast bruise much improved. Estimated may be another 4-6 weeks to resolve completely.  Preventive Care 85-58 Years Old, Female Preventive care refers to lifestyle choices and visits with your health care provider that can promote health and wellness. Preventive care visits are also called wellness exams. What can I expect for my preventive care visit? Counseling Your health care provider may ask you questions about your: Medical history, including: Past medical problems. Family medical history. Pregnancy history. Current health, including: Menstrual cycle. Method of birth control. Emotional well-being. Home life and relationship well-being. Sexual activity and sexual health. Lifestyle, including: Alcohol, nicotine or tobacco, and drug use. Access to firearms. Diet, exercise, and sleep habits. Work and work Astronomer. Sunscreen use. Safety issues such as seatbelt and bike helmet use. Physical exam Your health care provider will check your: Height and weight. These may be used to calculate your BMI (body mass index). BMI is a measurement that tells if you are at a healthy weight. Waist circumference. This measures the distance around your waistline. This measurement also tells if you are at a healthy weight and may help predict your risk of certain diseases, such as type 2 diabetes and high blood pressure. Heart rate and blood pressure. Body temperature. Skin for abnormal spots. What immunizations do I need?  Vaccines are usually given at various ages, according to a schedule. Your health care provider will recommend vaccines for you based on your  age, medical history, and lifestyle or other factors, such as travel or where you work. What tests do I need? Screening Your health care provider may recommend screening tests for certain conditions. This may include: Lipid and cholesterol levels. Diabetes screening. This is done by checking your blood sugar (glucose) after you have not eaten for a while (fasting). Pelvic exam and Pap test. Hepatitis B test. Hepatitis C test. HIV (human immunodeficiency virus) test. STI (sexually transmitted infection) testing, if you are at risk. Lung cancer screening. Colorectal cancer screening. Mammogram. Talk with your health care provider about when you should start having regular mammograms. This may depend on whether you have a family history of breast cancer. BRCA-related cancer screening. This may be done if you have a family history of breast, ovarian, tubal, or peritoneal cancers. Bone density scan. This is done to screen for osteoporosis. Talk with your health care provider about your test results, treatment options, and if necessary, the need for more tests. Follow these instructions at home: Eating and drinking  Eat a diet that includes fresh fruits and vegetables, whole grains, lean protein, and low-fat dairy products. Take vitamin and mineral supplements as recommended by your health care provider. Do not drink alcohol if: Your health care provider tells you not to drink. You are pregnant, may be pregnant, or are planning to become pregnant. If you drink alcohol: Limit how much you have to 0-1 drink a day. Know how much alcohol is in your drink. In the [place] one drink equals one 12 oz bottle of beer (355 mL), one 5 oz glass of wine (148 mL), or one 1 oz glass of hard liquor (44 mL). Lifestyle Brush  your teeth every morning and night with fluoride toothpaste. Floss one time each day. Exercise for at least 30 minutes 5 or more days each week. Do not use any products that contain  nicotine or tobacco. These products include cigarettes, chewing tobacco, and vaping devices, such as e-cigarettes. If you need help quitting, ask your health care provider. Do not use drugs. If you are sexually active, practice safe sex. Use a condom or other form of protection to prevent STIs. If you do not wish to become pregnant, use a form of birth control. If you plan to become pregnant, see your health care provider for a prepregnancy visit. Take aspirin only as told by your health care provider. Make sure that you understand how much to take and what form to take. Work with your health care provider to find out whether it is safe and beneficial for you to take aspirin daily. Find healthy ways to manage stress, such as: Meditation, yoga, or listening to music. Journaling. Talking to a trusted person. Spending time with friends and family. Minimize exposure to UV radiation to reduce your risk of skin cancer. Safety Always wear your seat belt while driving or riding in a vehicle. Do not drive: If you have been drinking alcohol. Do not ride with someone who has been drinking. When you are tired or distracted. While texting. If you have been using any mind-altering substances or drugs. Wear a helmet and other protective equipment during sports activities. If you have firearms in your house, make sure you follow all gun safety procedures. Seek help if you have been physically or sexually abused. What's next? Visit your health care provider once a year for an annual wellness visit. Ask your health care provider how often you should have your eyes and teeth checked. Stay up to date on all vaccines. This information is not intended to replace advice given to you by your health care provider. Make sure you discuss any questions you have with your health care provider. Document Revised: 08/24/2020 Document Reviewed: 08/24/2020 Elsevier Patient Education  2023 [employer].
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
## 2023-01-30 ENCOUNTER — Other Ambulatory Visit (HOSPITAL_BASED_OUTPATIENT_CLINIC_OR_DEPARTMENT_OTHER): Payer: Self-pay | Admitting: Obstetrics and Gynecology

## 2023-01-30 DIAGNOSIS — Z8249 Family history of ischemic heart disease and other diseases of the circulatory system: Secondary | ICD-10-CM

## 2023-06-27 ENCOUNTER — Ambulatory Visit (HOSPITAL_COMMUNITY)
Admission: EM | Admit: 2023-06-27 | Discharge: 2023-06-27 | Disposition: A | Discharge status: Home / Self Care | Attending: Emergency Medicine | Admitting: Emergency Medicine

## 2023-06-27 ENCOUNTER — Encounter (HOSPITAL_COMMUNITY): Payer: Self-pay | Admitting: Emergency Medicine

## 2023-06-27 ENCOUNTER — Ambulatory Visit: Payer: Self-pay

## 2023-06-27 ENCOUNTER — Ambulatory Visit (INDEPENDENT_AMBULATORY_CARE_PROVIDER_SITE_OTHER)

## 2023-06-27 DIAGNOSIS — M25472 Effusion, left ankle: Secondary | ICD-10-CM | POA: Insufficient documentation

## 2023-06-27 DIAGNOSIS — Z823 Family history of stroke: Secondary | ICD-10-CM | POA: Diagnosis present

## 2023-06-27 DIAGNOSIS — M25572 Pain in left ankle and joints of left foot: Secondary | ICD-10-CM

## 2023-06-27 LAB — CBC WITH DIFFERENTIAL/PLATELET
Abs Immature Granulocytes: 0.01 10*3/uL (ref 0.00–0.07)
Basophils Absolute: 0 10*3/uL (ref 0.0–0.1)
Basophils Relative: 1 %
Eosinophils Absolute: 0.1 10*3/uL (ref 0.0–0.5)
Eosinophils Relative: 2 %
HCT: 45.3 % (ref 36.0–46.0)
Hemoglobin: 14.7 g/dL (ref 12.0–15.0)
Immature Granulocytes: 0 %
Lymphocytes Relative: 31 %
Lymphs Abs: 1.8 10*3/uL (ref 0.7–4.0)
MCH: 30.4 pg (ref 26.0–34.0)
MCHC: 32.5 g/dL (ref 30.0–36.0)
MCV: 93.8 fL (ref 80.0–100.0)
Monocytes Absolute: 0.5 10*3/uL (ref 0.1–1.0)
Monocytes Relative: 8 %
Neutro Abs: 3.3 10*3/uL (ref 1.7–7.7)
Neutrophils Relative %: 58 %
Platelets: 306 10*3/uL (ref 150–400)
RBC: 4.83 MIL/uL (ref 3.87–5.11)
RDW: 12 % (ref 11.5–15.5)
WBC: 5.8 10*3/uL (ref 4.0–10.5)
nRBC: 0 % (ref 0.0–0.2)

## 2023-06-27 LAB — APTT: aPTT: 32 s (ref 24–36)

## 2023-06-27 LAB — SEDIMENTATION RATE: Sed Rate: 6 mm/h (ref 0–22)

## 2023-06-27 LAB — D-DIMER, QUANTITATIVE: D-Dimer, Quant: 0.39 ug{FEU}/mL (ref 0.00–0.50)

## 2023-06-27 NOTE — ED Triage Notes (Signed)
 Pt c/o lt ankle pain x10days and swelling x4 days. Denies injury. States has a PCP appt for Monday but nurse told her to come here to R/O blood clot.

## 2023-06-27 NOTE — Telephone Encounter (Addendum)
 Patient called from Urgent care with concerns of the information she was given by triage earlier today. Patient states Triage RN told her she might need imaging and blood work. UC provider told patient not sure why she was told to come to Urgent care if she needed any imaging other than X-ray. Blood work can be sent out but results don't come back for a few days. Patient was explained what can be done in UC and that the provider guides that practice. Patient verbalized understanding of plan and all questions answered.

## 2023-06-27 NOTE — Discharge Instructions (Addendum)
 The x-ray of your left ankle is not concerning for acute bony injury or joint dysfunction.  The results of your blood work will be available later today and we will contact you with those results.  The ultrasound of your left leg has been scheduled for 9 AM tomorrow morning (April 18) at [facility] at [address]. here in [place].  Once we receive those results, we will contact you to let you know what it showed.  As we discussed, at any time between now and when you see your doctor on Monday, if you experience worsening swelling, increased leg pain, please go to the emergency room immediately.  If you experience chest pain or shortness of breath, call 911.  Thank you for visiting [facility] today.

## 2023-06-27 NOTE — ED Provider Notes (Signed)
 [facility]    CSN: [id number] Arrival date & time: 06/27/23  1014    HISTORY   Chief Complaint  Patient presents with   Ankle Pain   HPI Vanessa Lopez is a pleasant, 58 y.o. female who presents to urgent care today. Patient complains of a 10-day history of left ankle pain and swelling for the past 4 days..  Patient denies known injury, recent fall, prior injury to left ankle.  Patient states she has and appointment scheduled for this issue 4 days from now but was advised to come to urgent care by the nurse at her primary care office, states she was advised to come here within 4 hours.  States she is not currently taking any type of hormone replacement, does not smoke.  Patient endorses family history of CVA in her biological sister.  Patient denies chest pain, shortness of breath, blood pressure is elevated  with otherwise normal vital signs on arrival, patient states she does not have a history of hypertension and is upset because her supervisor is giving her hard time about leaving work today.  The history is provided by the patient.  Ankle Pain Location:  Ankle Injury: no   Ankle location:  L ankle Pain details:    Quality:  Aching   Radiates to:  Does not radiate   Severity:  Moderate   Onset quality:  Gradual   Duration:  10 days Chronicity:  New Prior injury to area:  No Relieved by:  None tried Ineffective treatments:  None tried Associated symptoms: swelling    Past Medical History:  Diagnosis Date   Anemia    history   There are no active problems to display for this patient.  Past Surgical History:  Procedure Laterality Date   BREAST EXCISIONAL BIOPSY     left   DILATION AND CURETTAGE OF UTERUS     LAPAROSCOPIC TUBAL LIGATION  02/02/2011   Procedure: LAPAROSCOPIC TUBAL LIGATION;  Surgeon: Chandler Combs;  Location: [facility];  Service: Gynecology;  Laterality: Bilateral;   svd     x 1   WISDOM TOOTH EXTRACTION     OB History   No obstetric history  on file.    Home Medications    Prior to Admission medications   Medication Sig Start Date End Date Taking? Authorizing Provider  clobetasol ointment (TEMOVATE) 0.05 % Apply to scalp 3 times per week. Not to face. 11/06/21   [provider]  triamcinolone cream (KENALOG) 0.1 % Apply to affected areas on the face 1-2 times daily as needed for 2 weeks then stop. 01/20/14   [provider]    Family History No family history on file. Social History Social History   Tobacco Use   Smoking status: Never   Smokeless tobacco: Never  Substance Use Topics   Alcohol use: Yes    Comment: 7 glasses per week - wine   Drug use: No   Allergies   Patient has no known allergies.  Review of Systems Review of Systems Pertinent findings revealed after performing a 14 point review of systems has been noted in the history of present illness.  Physical Exam Vital Signs BP (!) 154/101 (BP Location: Right Arm)   Pulse 69   Temp 98.1 F (36.7 C) (Oral)   Resp 18   LMP 04/02/2014   SpO2 98%   No data found.  Physical Exam Vitals and nursing note reviewed.  Constitutional:      General: She is  not in acute distress.    Appearance: Normal appearance.  HENT:     Head: Normocephalic and atraumatic.  Eyes:     Pupils: Pupils are equal, round, and reactive to light.  Cardiovascular:     Rate and Rhythm: Normal rate and regular rhythm.  Pulmonary:     Effort: Pulmonary effort is normal.     Breath sounds: Normal breath sounds.  Musculoskeletal:        General: Normal range of motion.     Cervical back: Normal range of motion and neck supple.     Left lower leg: 2+ Pitting Edema present.     Left ankle: Swelling present. No deformity or ecchymosis. Tenderness present over the lateral malleolus (Superior to lateral malleolus). Normal range of motion. Anterior drawer test negative.     Left Achilles Tendon: Normal.  Skin:    General: Skin is warm and dry.  Neurological:      General: No focal deficit present.     Mental Status: She is alert and oriented to person, place, and time. Mental status is at baseline.  Psychiatric:        Mood and Affect: Mood normal.        Behavior: Behavior normal.        Thought Content: Thought content normal.        Judgment: Judgment normal.     UC Couse / Diagnostics / Procedures:     Radiology No results found.  Procedures Procedures (including critical care time) EKG  Pending results:  Labs Reviewed  APTT  CBC WITH DIFFERENTIAL/PLATELET  D-DIMER, QUANTITATIVE  SEDIMENTATION RATE    Medications Ordered in UC: Medications - No data to display  UC Diagnoses / Final Clinical Impressions(s)   I have reviewed the triage vital signs and the nursing notes.  Pertinent labs & imaging results that were available during my care of the patient were reviewed by me and considered in my medical decision making (see chart for details).    Final diagnoses:  Acute left ankle pain  Left ankle swelling  Family history of CVA  X-ray of left ankle is unremarkable, patient advised.  Venous ultrasound ordered at [facility] imaging, they were unable to work patient in today so she will have this done tomorrow morning 9 a.m.  Stat labs ordered here at urgent care, we will contact her with those results once received.  Emergency precautions advised.  Please see discharge instructions below for details of plan of care as provided to patient. ED Prescriptions   None    PDMP not reviewed this encounter.    Discharge Instructions      The x-ray of your left ankle is not concerning for acute bony injury or joint dysfunction.  The results of your blood work will be available later today and we will contact you with those results.  The ultrasound of your left leg has been scheduled for 9 AM tomorrow morning (April 18) at [facility] at [address] here in [place].  Once we receive those  results, we will contact you to let you know what it showed.  As we discussed, at any time between now and when you see your doctor on Monday, if you experience worsening swelling, increased leg pain, please go to the emergency room immediately.  If you experience chest pain or shortness of breath, call 911.  Thank you for visiting [facility] today.      Disposition  Upon Discharge:  Condition: stable for discharge home Home: take medications as prescribed; routine discharge instructions as discussed; follow up as advised.  Patient presented with an acute illness with associated systemic symptoms and significant discomfort requiring urgent management. In my opinion, this is a condition that a prudent lay person (someone who possesses an average knowledge of health and medicine) may potentially expect to result in complications if not addressed urgently such as respiratory distress, impairment of bodily function or dysfunction of bodily organs.   Routine symptom specific, illness specific and/or disease specific instructions were discussed with the patient and/or caregiver at length.   As such, the patient has been evaluated and assessed, work-up was performed and treatment was provided in alignment with urgent care protocols and evidence based medicine.  Patient/parent/caregiver has been advised that the patient may require follow up for further testing and treatment if the symptoms continue in spite of treatment, as clinically indicated and appropriate.  Patient/parent/caregiver has been advised to report to orthopedic urgent care clinic or return to the [facility] or PCP in 3-5 days if no better; follow-up with orthopedics, PCP or the Emergency Department if new signs and symptoms develop or if the current signs or symptoms continue to change or worsen for further workup, evaluation and treatment as clinically indicated and appropriate  The patient will follow up with their current PCP  if and as advised. If the patient does not currently have a PCP we will have assisted them in obtaining one.   The patient may need specialty follow up if the symptoms continue, in spite of conservative treatment and management, for further workup, evaluation, consultation and treatment as clinically indicated and appropriate.  Patient/parent/caregiver verbalized understanding and agreement of plan as discussed.  All questions were addressed during visit.  Please see discharge instructions below for further details of plan.  This office note has been dictated using Teaching laboratory technician.  Unfortunately, this method of dictation can sometimes lead to typographical or grammatical errors.  I apologize for your inconvenience in advance if this occurs.  Please do not hesitate to reach out to me if clarification is needed.      Eloise Hake Scales, PA-C 06/27/23 1255

## 2023-06-27 NOTE — Telephone Encounter (Signed)
 Chief Complaint: L ankle pain and swelling Symptoms: L ankle pain and swelling Frequency: swelling since Monday, pain since last week Pertinent Negatives: Patient denies fever, redness, CP, SOB Disposition: [] ED /[x] Urgent Care (no appt availability in office) / [] Appointment(In office/virtual)/ []  [facility]/ [] Home Care/ [] Refused Recommended Disposition /[] [facility]/ []  Follow-up with PCP Additional Notes: Pt reports L ankle pain and swelling. Pt reports pain began last week and swelling began Monday. Swelling is not pitting. Ankle is painful to the touch, especially near the ankle bone. Pt is able to walk with a limp. Denies redness. Denies calf swelling or calf pain. No hx of DVT or any cardiac or lung hx. Pt walks 4-5 days a week in the AM but denies any injury or overuse. Pt states she is employing the RICE method and icing it helps but the swelling progressively worsens during the day. RN advised pt she should be seen within 4 hrs. No availability in the office during that timeframe. Pt had made herself an appt for Monday. RN kept that appt on the pt's appt desk at this time. Pt states she will leave work and go to the [employer] in [place]. RN advised pt that if she develops SOB, CP, or palpitations she should call 911 instead. Pt verbalized understanding.    Copied from CRM [id number]. Topic: Clinical - Red Word Triage >> Jun 27, 2023  9:32 AM Efraim Kaufmann C wrote: Red Word that prompted transfer to Nurse Triage: patient made her own appointment on Mychart for swelling and stated she is in a lot of pain. Office will be closed tomorrow. Reason for Disposition  [1] Thigh, calf, or ankle swelling AND [2] only 1 side  Answer Assessment - Initial Assessment Questions 1. LOCATION: "Which ankle is swollen?" "Where is the swelling?"     L ankle - pt states swelling is localized to the ankle and "goes up a little bit" but not to her calf or her shin 2. ONSET: "When did the  swelling start?"     Swelling started Monday night, pain started last week  3. SWELLING: "How bad is the swelling?" Or, "How large is it?" (e.g., mild, moderate, severe; size of localized swelling)    - NONE: No joint swelling.   - LOCALIZED: Localized; small area of puffy or swollen skin (e.g., insect bite, skin irritation).   - MILD: Joint looks or feels mildly swollen or puffy.   - MODERATE: Swollen; interferes with normal activities (e.g., work or school); decreased range of movement; may be limping.   - SEVERE: Very swollen; can't move swollen joint at all; limping a lot or unable to walk.     Not pitting, no redness to swelling, pt is still able to walk, limited range of motion  4. PAIN: "Is there any pain?" If Yes, ask: "How bad is it?" (Scale 1-10; or mild, moderate, severe)   - NONE (0): no pain.   - MILD (1-3): doesn't interfere with normal activities.    - MODERATE (4-7): interferes with normal activities (e.g., work or school) or awakens from sleep, limping.    - SEVERE (8-10): excruciating pain, unable to do any normal activities, unable to walk.      5/10 at it's worst, tends to get worse throughout the day 5. CAUSE: "What do you think caused the ankle swelling?"     Not sure  6. OTHER SYMPTOMS: "Do you have any other symptoms?" (e.g., fever, chest pain, difficulty breathing, calf pain)  Pt states she is employing the RICE method and is trying to stay off the ankle and use ice. Pt states ice helps but throughout the day swelling comes back. "Slight limp." Can't recall any injuries but states she walks a lot in the AM 4-5 days a week. Pt states she thought it may be d/t a bite but no memory of that. Pt states area is painful to the touch. Tender close to ankle bone. Denies fever or chills. No calf pain. No CP or SOB.  Protocols used: Ankle Swelling-A-AH

## 2023-06-28 ENCOUNTER — Ambulatory Visit (HOSPITAL_COMMUNITY)
Admission: RE | Admit: 2023-06-28 | Discharge: 2023-06-28 | Disposition: A | Discharge status: Home / Self Care | Source: Ambulatory Visit | Attending: Vascular Surgery | Admitting: Vascular Surgery

## 2023-06-28 DIAGNOSIS — M79661 Pain in right lower leg: Secondary | ICD-10-CM | POA: Diagnosis present

## 2023-06-28 DIAGNOSIS — M79605 Pain in left leg: Secondary | ICD-10-CM

## 2023-07-01 ENCOUNTER — Encounter: Payer: Self-pay | Admitting: Medical

## 2023-07-01 ENCOUNTER — Ambulatory Visit: Admitting: Medical

## 2023-07-01 ENCOUNTER — Ambulatory Visit (INDEPENDENT_AMBULATORY_CARE_PROVIDER_SITE_OTHER): Admitting: Medical

## 2023-07-01 VITALS — BP 118/75 | HR 66 | Ht 69.0 in | Wt 136.0 lb

## 2023-07-01 DIAGNOSIS — M25572 Pain in left ankle and joints of left foot: Secondary | ICD-10-CM

## 2023-07-01 LAB — URIC ACID: Uric Acid, Serum: 4.5 mg/dL (ref 2.4–7.0)

## 2023-07-01 NOTE — Progress Notes (Signed)
   Subjective:    Patient ID: Vanessa Lopez, female    DOB: 07/26/1965, 58 y.o.   MRN: [id number]  HPI  Brihany Butch is a 58 year old female who presents with left ankle swelling and pain.  She has been experiencing left ankle discomfort for a couple of weeks, with noticeable swelling beginning last week. No known injury or insect bite. The swelling is significant, with visible fluid upon palpation. There is no history of injury, twist, or fall. The swelling is associated with pain, especially upon touch, and some discoloration. Resting the ankle has slightly reduced the swelling.  She visited urgent care where x-rays showed no fracture or sprain, but mild soft tissue thickening along the lateral ankle. An ultrasound at a cardiovascular center ruled out a clot. Blood tests, including CBC and D-dimer, were normal.  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 She has been limping for the past two weeks due to pain.  No warmth to touch, redness, or increased tenderness in the ankle.  Review of Systems See hp    Objective:   Physical Exam General- No acute distress. Pleasant patient. Neck- Full range of motion, no jvd Lungs- Clear, even and unlabored. Heart- regular rate and rhythm. Neurologic- CNII- XII grossly intact.  Left ankle- faint lateral maleolus swelling. No brusing, no redness, no warmth. Good rom. Rt ankle-normal.       Assessment & Plan:  Assessment and Plan            Patient Instructions  Left ankle swelling and pain Two-week history of left ankle swelling and pain without trauma. Imaging and labs ruled out fracture, sprain, and DVT. Mild soft tissue thickening noted. Gout considered due to lack of other findings. Cellulitis unlikely. Symptoms possibly exacerbated by extensive walking. - Order uric acid level to evaluate for gout. - Apply ACE wrap to the left ankle. - Prescribe  acetaminophen  325 mg and ibuprofen 200 mg every 8 hours for pain. - Advise daily removal of ACE wrap to check for redness, increased swelling, or tenderness, and report changes. - Instruct to hold acetaminophen  and ibuprofen on Thursday, remove ACE wrap, and walk to assess pain. 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 - Consider referral to sports medicine or podiatrist if pain persists after Thursday.  Follow-up Monitor response to holding medication and removing ACE wrap to assess need for specialist referral and return to part-time work. - Follow up on Thursday to report on pain status after holding medication and removing the ACE wrap.   Rosaleen Mazer, PA-C

## 2023-07-01 NOTE — Patient Instructions (Signed)
 Left ankle swelling and pain Two-week history of left ankle swelling and pain without trauma. Imaging and labs ruled out fracture, sprain, and DVT. Mild soft tissue thickening noted. Gout considered due to lack of other findings. Cellulitis unlikely. Symptoms possibly exacerbated by extensive walking. - Order uric acid level to evaluate for gout. - Apply ACE wrap to the left ankle. - Prescribe acetaminophen  325 mg and ibuprofen 200 mg every 8 hours for pain. - Advise daily removal of ACE wrap to check for redness, increased swelling, or tenderness, and report changes. - Instruct to hold acetaminophen  and ibuprofen on Thursday, remove ACE wrap, and walk to assess pain. 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 - Consider referral to sports medicine or podiatrist if pain persists after Thursday.  Follow-up Monitor response to holding medication and removing ACE wrap to assess need for specialist referral and return to part-time work. - Follow up on Thursday to report on pain status after holding medication and removing the ACE wrap.

## 2023-07-05 ENCOUNTER — Encounter: Payer: Self-pay | Admitting: Medical

## 2023-07-06 NOTE — Addendum Note (Signed)
 Addended by: Serafina Damme on: 07/06/2023 12:15 PM   Modules accepted: Orders

## 2023-07-17 ENCOUNTER — Ambulatory Visit: Payer: Self-pay

## 2023-07-17 ENCOUNTER — Ambulatory Visit: Payer: Self-pay | Admitting: *Deleted

## 2023-07-17 NOTE — Telephone Encounter (Signed)
 Message from Allyne Areola sent at 07/17/2023  2:50 PM EDT  Copied From CRM [id number]. Reason for Triage: Patient feels like there maybe something stuck in her throat, she is not having any difficulty breathing or swallowing. She is not experiencing any symptoms but feels like what she ate last is just sitting there. No other complaints and did not want to schedule an appointment if not necessary.    Call History  Contact Date/Time Type Contact Phone/Fax By  07/17/2023 02:46 PM EDT Phone (Incoming) Vanessa Lopez   Reason for Disposition  Requesting regular office appointment  Answer Assessment - Initial Assessment Questions 1. REASON FOR CALL or QUESTION: "What is your reason for calling today?" or "How can I best help you?" or "What question do you have that I can help answer?"     I feel like something is stuck in my throat it started yesterday.  I've had a really bad cold and I've been coughing a lot.   My appetite has not been good as a result.   Yesterday I ate a chocolate bar.   It did not feel like it went down.  I ate bread and drank water and nothing is making it go away.     Any advice what to do?   No trouble breathing.   I had a [employer] biscuit today.  I ate it fine.   My throat is not as sore as it was and it's barely sore now.   I've been coughing a lot during the cold.     I can come in and let y'all look at it.   It's still going on.  Protocols used: Information Only Call - No Triage-A-AH

## 2023-07-17 NOTE — Telephone Encounter (Signed)
  Chief Complaint: Feels like something is stuck in her throat since Tues.   Symptoms: I've been sick with a really bad cold recently where I coughed a lot.  I did not have much appetite so did not eat much.   But after eating part of a chocolate bar I just never felt like it went on down even with drinking a lot of water and eating bread and all nothing seems to be making it go on down.   Frequency: No trouble breathing or speaking or swallowing.  Started Tues. Pertinent Negatives: Patient denies trouble breathing or swallowing.   Ate a [employer] biscuit this morning without a problem. Disposition: [] ED /[] Urgent Care (no appt availability in office) / [x] Appointment(In office/virtual)/ []  [facility]/ [] Home Care/ [] Refused Recommended Disposition /[] [facility]/ []  Follow-up with PCP Additional Notes: Appt made for 9:20 with Sylvia Everts, PA-C for 07/18/2023.

## 2023-07-17 NOTE — Telephone Encounter (Signed)
 See other Nurse Triage encounter. Pt was triaged and scheduled by another RN. Pt has an appt tomorrow at 0920. RN advised pt if she becomes unable to swallow or has difficulty breathing she should go to the ED. Pt verbalized understanding.   Copied from CRM [id number]. Topic: Clinical - Pink Word Triage >> Jul 17, 2023  2:48 PM Allyne Areola wrote: Reason for Triage: Patient feels like there maybe something stuck in her throat, she is not having any difficulty breathing or swallowing. She is not experiencing any symptoms but feels like what she ate last is just sitting there. No other complaints and did not want to schedule an appointment if not necessary.

## 2023-07-18 ENCOUNTER — Ambulatory Visit (INDEPENDENT_AMBULATORY_CARE_PROVIDER_SITE_OTHER): Admitting: Medical

## 2023-07-18 VITALS — BP 120/90 | HR 63 | Temp 97.6°F | Resp 16 | Ht 69.0 in | Wt 133.0 lb

## 2023-07-18 DIAGNOSIS — R1310 Dysphagia, unspecified: Secondary | ICD-10-CM | POA: Diagnosis not present

## 2023-07-18 MED ORDER — OMEPRAZOLE 20 MG PO CPDR: 20.0000 mg | DELAYED_RELEASE_CAPSULE | Freq: Every day | ORAL | 0 refills | Status: AC

## 2023-07-18 MED ORDER — FAMOTIDINE 20 MG PO TABS: 20.0000 mg | ORAL_TABLET | Freq: Every day | ORAL | 0 refills | Status: AC

## 2023-07-18 NOTE — Patient Instructions (Signed)
 Dysphagia Dysphagia with sensation somewhat like food being stuck in the throat. Differential includes esophageal spasm, stenosis, or achalasia, though less likely due to acute onset.(possible gerd though atypical symptoms) - Start omeprazole once daily for three days. - Add famotidine if symptoms persist. - Refer to gastroenterology for possible endoscopy if symptoms do not improve. - Avoid fried foods, alcohol, and fruit juices for two weeks.  Acute viral upper respiratory infection Symptoms of acute viral upper respiratory infection are subsiding. Possible allergy contribution noted. - Continue Zyrtec for allergy symptoms as needed.  Follow up date to be determined after my chart update in about one week.

## 2023-07-18 NOTE — Progress Notes (Signed)
 Subjective:    Patient ID: Vanessa Lopez, female    DOB: December 08, 1965, 58 y.o.   MRN: [id number]  HPI Vanessa Lopez is a 58 year old female who presents with a sensation of food being stuck in her throat following a recent cold.  She experiences a sensation of food being stuck in her throat, which began after a recent cold. The feeling is described as if food is not going down, although she is able to swallow liquids and has not experienced choking. This sensation started on Sunday night or Monday, following a period of illness with a cold.  During the cold, which lasted about a week to ten days, she experienced significant nasal congestion, sneezing, and head pressure. She managed her symptoms with Mucinex DM, Zyrtec, and Nyquil, which provided relief. The cold symptoms have been subsiding, but she still feels some congestion and drainage.  The sensation of food being stuck began after she attempted to eat solid food, specifically a candy bar on Tuesday, which felt like it remained in her throat. She also experienced this sensation with other foods like chips and a biscuit. Despite this, she has not had any issues with nausea, vomiting, belching, or heartburn.  No history of heartburn or gastroesophageal reflux disease (GERD) and no episodes of choking or difficulty swallowing prior to this incident. No sour taste or acid in her mouth.  She occasionally consumes alcohol and generally maintains a healthy diet, though she sometimes eats fried foods and drinks fruit juices.   Review of Systems  Constitutional:  Negative for chills, fatigue and fever.  HENT:         Faint residual nasal congestion. See hpi.  Dysphagia described.  Respiratory:  Negative for cough, chest tightness, shortness of breath and wheezing.   Cardiovascular:  Negative for chest pain and palpitations.  Gastrointestinal:  Negative for anal bleeding, diarrhea and nausea.  Genitourinary:  Negative for frequency and urgency.   Musculoskeletal:  Negative for back pain.  Neurological:  Negative for dizziness, syncope and headaches.  Hematological:  Negative for adenopathy. Does not bruise/bleed easily.  Psychiatric/Behavioral:  Negative for behavioral problems and confusion.     Past Medical History:  Diagnosis Date   Anemia    history     Social History   Socioeconomic History   Marital status: Married    Spouse name: Not on file   Number of children: Not on file   Years of education: Not on file   Highest education level: Some college, no degree  Occupational History   Not on file  Tobacco Use   Smoking status: Never   Smokeless tobacco: Never  Substance and Sexual Activity   Alcohol use: Yes    Comment: 7 glasses per week - wine   Drug use: No   Sexual activity: Yes    Birth control/protection: None, I.U.D.  Other Topics Concern   Not on file  Social History Narrative   Not on file   Social Drivers of Health   Financial Resource Strain: Low Risk  (06/28/2023)   Overall Financial Resource Strain (CARDIA)    Difficulty of Paying Living Expenses: Not hard at all  Food Insecurity: No Food Insecurity (06/28/2023)   Hunger Vital Sign    Worried About Running Out of Food in the Last Year: Never true    Ran Out of Food in the Last Year: Never true  Transportation Needs: No Transportation Needs (06/28/2023)   PRAPARE - Transportation    Lack  of Transportation (Medical): No    Lack of Transportation (Non-Medical): No  Physical Activity: Sufficiently Active (06/28/2023)   Exercise Vital Sign    Days of Exercise per Week: 5 days    Minutes of Exercise per Session: 150+ min  Stress: No Stress Concern Present (06/28/2023)   [employer] of Occupational Health - Occupational Stress Questionnaire    Feeling of Stress : Not at all  Social Connections: Moderately Integrated (06/28/2023)   Social Connection and Isolation Panel [NHANES]    Frequency of Communication with Friends and Family: More  than three times a week    Frequency of Social Gatherings with Friends and Family: Once a week    Attends Religious Services: More than 4 times per year    Active Member of [employer] or Organizations: No    Attends Engineer, structural: Not on file    Marital Status: Married  Intimate Partner Violence: Unknown (06/16/2021)   Received from [employer], [facility]   HITS    Physically Hurt: Not on file    Insult or Talk Down To: Not on file    Threaten Physical Harm: Not on file    Scream or Curse: Not on file    Past Surgical History:  Procedure Laterality Date   BREAST EXCISIONAL BIOPSY     left   DILATION AND CURETTAGE OF UTERUS     LAPAROSCOPIC TUBAL LIGATION  02/02/2011   Procedure: LAPAROSCOPIC TUBAL LIGATION;  Surgeon: Chandler Combs;  Location: [facility];  Service: Gynecology;  Laterality: Bilateral;   svd     x 1   WISDOM TOOTH EXTRACTION      No family history on file.  No Known Allergies  Current Outpatient Medications on File Prior to Visit  Medication Sig Dispense Refill   clobetasol ointment (TEMOVATE) 0.05 % Apply to scalp 3 times per week. Not to face.     triamcinolone cream (KENALOG) 0.1 % Apply to affected areas on the face 1-2 times daily as needed for 2 weeks then stop.     No current facility-administered medications on file prior to visit.    BP (!) 120/90   Pulse 63   Temp 97.6 F (36.4 C) (Oral)   Resp 16   Ht 5\' 9"  (1.753 m)   Wt 133 lb (60.3 kg)   LMP 04/02/2014   SpO2 100%   BMI 19.64 kg/m        Objective:   Physical Exam  General Mental Status- Alert. General Appearance- Not in acute distress.   Skin General: Color- Normal Color. Moisture- Normal Moisture.  Neck  No JVD.  Chest and Lung Exam Auscultation: Breath Sounds:-CTA  Cardiovascular Auscultation:Rythm- RRR Murmurs & Other Heart Sounds:Auscultation of the heart reveals- No Murmurs.  Abdomen Inspection:-Inspeection Normal. Palpation/Percussion:Note:No  mass. Palpation and Percussion of the abdomen reveal- Non Tender, Non Distended + BS, no rebound or guarding.    Neurologic Cranial Nerve exam:- CN III-XII intact(No nystagmus), symmetric smile. Strength:- 5/5 equal and symmetric strength both upper and lower extremities.       Assessment & Plan:   Patient Instructions  Dysphagia Dysphagia with sensation somewhat like food being stuck in the throat. Differential includes esophageal spasm, stenosis, or achalasia, though less likely due to acute onset.(possible gerd though atypical symptoms) - Start omeprazole  once daily for three days. - Add famotidine  if symptoms persist. - Refer to gastroenterology for possible endoscopy if symptoms do not improve. - Avoid fried foods, alcohol, and fruit juices  for two weeks.  Acute viral upper respiratory infection Symptoms of acute viral upper respiratory infection are subsiding. Possible allergy contribution noted. - Continue Zyrtec for allergy symptoms as needed.  Follow up date to be determined after my chart update in about one week.   Vanessa Snowden, PA-C

## 2023-08-08 ENCOUNTER — Encounter: Payer: Self-pay | Admitting: Medical

## 2023-11-05 ENCOUNTER — Telehealth: Payer: Self-pay

## 2023-11-05 MED ORDER — PERMETHRIN 5 % EX CREA: TOPICAL_CREAM | CUTANEOUS | 0 refills | Status: AC

## 2023-11-05 MED ORDER — PERMETHRIN 5 % EX CREA: TOPICAL_CREAM | CUTANEOUS | 0 refills | Status: DC

## 2023-11-05 NOTE — Telephone Encounter (Signed)
 Pt.notified

## 2023-11-05 NOTE — Addendum Note (Signed)
 Addended by: DORINA DALLAS HERO on: 11/05/2023 05:13 PM   Modules accepted: Orders

## 2023-11-05 NOTE — Telephone Encounter (Signed)
 No rash present , no itchy or redness , wants medication sent to [facility] on [address]  Patient wants to know if son will need prescription since he lives in the house with them

## 2023-11-05 NOTE — Telephone Encounter (Signed)
 Copied from CRM [id number]. Topic: Clinical - Medication Question >> Nov 05, 2023 12:51 PM Deleta RAMAN wrote: Reason for CRM: patient spouse is calling due to rash possible scabbies. Patient spouse is Arlen, Legendre DOB 11/02/1966. His wife would like to be prescribed permethrin  is the rash is contagious. Please follow up at [phone]. Patient aware of call back time

## 2023-11-05 NOTE — Addendum Note (Signed)
 Addended by: DORINA DALLAS HERO on: 11/05/2023 03:20 PM   Modules accepted: Orders

## 2024-03-02 ENCOUNTER — Ambulatory Visit: Admitting: Medical

## 2024-03-02 VITALS — BP 138/90 | HR 64 | Temp 97.8°F | Resp 15 | Ht 69.0 in | Wt 145.4 lb

## 2024-03-02 DIAGNOSIS — R5383 Other fatigue: Secondary | ICD-10-CM | POA: Diagnosis not present

## 2024-03-02 DIAGNOSIS — M2550 Pain in unspecified joint: Secondary | ICD-10-CM | POA: Diagnosis not present

## 2024-03-02 NOTE — Patient Instructions (Signed)
 Ocular symptoms under evaluation for systemic inflammatory or autoimmune disease Persistent ocular irritation despite treatment. Differential includes lupus, rheumatoid arthritis, sarcoidosis. Awaiting optometrist input.(notes) - Ordered CBC, CRP, ANA, rheumatoid factor, sed rate, TSH, T4. - Requested clinical reasoning from optometrist via fax. - Follow up with optometrist on December 26th, 2025.  Unexplained weight gain Weight gain of 12 pounds since May. No dietary or activity changes. Possible thyroid dysfunction. - Monitor weight at home and report weight before showering tomorrow. - Ordered thyroid function tests (TSH and T4). -if any lower extremity edema or dyspnea get cxr  Fatigue -mild and get labs cbc tsh, t4 and cmp  Arthralgia(mild and only ankle)  Considered in differential for systemic inflammatory conditions. - Ordered inflammatory markers listed on summary  Follow up date to be determined after lab review

## 2024-03-02 NOTE — Progress Notes (Signed)
" ° °  Subjective:    Patient ID: Margretta Gaskins, female    DOB: [birth year], 58 y.o.   MRN: [id number]  HPI  Keyaria Lawson is a 58 year old female who presents with persistent eye irritation.  She has had persistent eye irritation for the past couple of months. An optometrist prescribed eye drops that did not help so the medication was changed. She used the new drops inconsistently, about two to three times daily instead of four, with partial improvement and recurrence of irritation after stopping them.  optemetrist recommended getting inflammation studies, cbc and thyroid studies.  Pt has hx of ankle pain but no other arthralgias. On optometrist note no differential dx in terms of inflammatory disorder concerned for.  Later in day got notes but limited in detail. States conjunctival edema. Looks like so far given combination of antibiotic and steroid drop and steroid drops.   She notes fatigue and at end of visit mentioned also unexplained 12-pound weight gain since May, without changes in eating habits.  No swelling of her legs or shortness of breath.     Review of Systems     Objective:   Physical Exam  General- No acute distress. Pleasant patient. Neck- Full range of motion, no jvd Lungs- Clear, even and unlabored. Heart- regular rate and rhythm. Neurologic- CNII- XII grossly intact.  Heent- no sinus pressure. Eyes-perrl bilaterall. Left conjuntiva faint/ mild injected. No dc. EOM intact.      Assessment & Plan:   Ocular symptoms under evaluation for systemic inflammatory or autoimmune disease Persistent ocular irritation despite treatment. Differential includes lupus, rheumatoid arthritis, sarcoidosis. Awaiting optometrist input.(notes) - Ordered CBC, CRP, ANA, rheumatoid factor, sed rate, TSH, T4. - Requested clinical reasoning from optometrist via fax. - Follow up with optometrist on December 26th, 2025.  Unexplained weight gain Weight gain of 12 pounds since May. No dietary  or activity changes. Possible thyroid dysfunction. - Monitor weight at home and report weight before showering tomorrow. - Ordered thyroid function tests (TSH and T4). -if any lower extremity edema or dyspnea get cxr  Fatigue -mild and get labs cbc tsh, t4 and cmp  Arthralgia(mild and only ankle)  Considered in differential for systemic inflammatory conditions. - Ordered inflammatory markers listed on summary  Follow up date to be determined after lab review  Dallas Maxwell, PA-C  "

## 2024-03-03 LAB — CBC WITH DIFFERENTIAL/PLATELET
Basophils Absolute: 0 K/uL (ref 0.0–0.1)
Basophils Relative: 0.4 % (ref 0.0–3.0)
Eosinophils Absolute: 0.2 K/uL (ref 0.0–0.7)
Eosinophils Relative: 3.8 % (ref 0.0–5.0)
HCT: 41.4 % (ref 36.0–46.0)
Hemoglobin: 13.7 g/dL (ref 12.0–15.0)
Lymphocytes Relative: 42.6 % (ref 12.0–46.0)
Lymphs Abs: 2.4 K/uL (ref 0.7–4.0)
MCHC: 33 g/dL (ref 30.0–36.0)
MCV: 96.4 fl (ref 78.0–100.0)
Monocytes Absolute: 0.4 K/uL (ref 0.1–1.0)
Monocytes Relative: 7.6 % (ref 3.0–12.0)
Neutro Abs: 2.6 K/uL (ref 1.4–7.7)
Neutrophils Relative %: 45.6 % (ref 43.0–77.0)
Platelets: 321 K/uL (ref 150.0–400.0)
RBC: 4.3 Mil/uL (ref 3.87–5.11)
RDW: 14.1 % (ref 11.5–15.5)
WBC: 5.7 K/uL (ref 4.0–10.5)

## 2024-03-03 LAB — COMPREHENSIVE METABOLIC PANEL WITH GFR
ALT: 15 U/L (ref 3–35)
AST: 18 U/L (ref 5–37)
Albumin: 4.8 g/dL (ref 3.5–5.2)
Alkaline Phosphatase: 109 U/L (ref 39–117)
BUN: 9 mg/dL (ref 6–23)
CO2: 29 meq/L (ref 19–32)
Calcium: 9.9 mg/dL (ref 8.4–10.5)
Chloride: 103 meq/L (ref 96–112)
Creatinine, Ser: 0.76 mg/dL (ref 0.40–1.20)
GFR: 85.69 mL/min
Glucose, Bld: 84 mg/dL (ref 70–99)
Potassium: 4.7 meq/L (ref 3.5–5.1)
Sodium: 140 meq/L (ref 135–145)
Total Bilirubin: 0.7 mg/dL (ref 0.2–1.2)
Total Protein: 7.7 g/dL (ref 6.0–8.3)

## 2024-03-03 LAB — C-REACTIVE PROTEIN: CRP: <0.5 mg/dL — ABNORMAL LOW (ref 1.0–20.0)

## 2024-03-03 LAB — ANA: Anti Nuclear Antibody (ANA): NEGATIVE

## 2024-03-03 LAB — TSH: TSH: 1.29 u[IU]/mL (ref 0.35–5.50)

## 2024-03-03 LAB — URIC ACID: Uric Acid, Serum: 4.4 mg/dL (ref 2.4–7.0)

## 2024-03-03 LAB — SEDIMENTATION RATE: Sed Rate: 3 mm/h (ref 0–30)

## 2024-03-03 LAB — RHEUMATOID FACTOR: Rheumatoid fact SerPl-aCnc: <10 [IU]/mL

## 2024-03-03 LAB — T4, FREE: Free T4: 0.86 ng/dL (ref 0.60–1.60)

## 2024-03-04 ENCOUNTER — Ambulatory Visit: Payer: Self-pay | Admitting: Medical

## 2024-03-17 ENCOUNTER — Other Ambulatory Visit: Payer: Self-pay | Admitting: Obstetrics and Gynecology

## 2024-03-17 DIAGNOSIS — Z8249 Family history of ischemic heart disease and other diseases of the circulatory system: Secondary | ICD-10-CM

## 2024-05-29 ENCOUNTER — Other Ambulatory Visit
